# Patient Record
Sex: Male | Born: 1945
Health system: Southern US, Community
[De-identification: ages and names within clinical notes are randomized; demographics above are authoritative.]

## PROBLEM LIST (undated history)

## (undated) DIAGNOSIS — H3530 Unspecified macular degeneration: Secondary | ICD-10-CM

## (undated) DIAGNOSIS — H9190 Unspecified hearing loss, unspecified ear: Secondary | ICD-10-CM

## (undated) DIAGNOSIS — J449 Chronic obstructive pulmonary disease, unspecified: Secondary | ICD-10-CM

## (undated) DIAGNOSIS — H269 Unspecified cataract: Secondary | ICD-10-CM

## (undated) DIAGNOSIS — M069 Rheumatoid arthritis, unspecified: Secondary | ICD-10-CM

## (undated) HISTORY — PX: LUNG BIOPSY: SHX232

---

## 2006-10-14 ENCOUNTER — Ambulatory Visit: Payer: Self-pay | Admitting: Pulmonary Disease

## 2006-10-14 ENCOUNTER — Inpatient Hospital Stay (HOSPITAL_COMMUNITY): Admission: AD | Admit: 2006-10-14 | Discharge: 2006-10-27 | Payer: Self-pay | Admitting: Thoracic Surgery

## 2006-10-14 ENCOUNTER — Ambulatory Visit: Payer: Self-pay | Admitting: Thoracic Surgery

## 2006-10-15 ENCOUNTER — Encounter (INDEPENDENT_AMBULATORY_CARE_PROVIDER_SITE_OTHER): Payer: Self-pay | Admitting: Specialist

## 2006-10-17 ENCOUNTER — Ambulatory Visit: Payer: Self-pay | Admitting: Infectious Diseases

## 2006-10-19 ENCOUNTER — Ambulatory Visit: Payer: Self-pay | Admitting: Internal Medicine

## 2006-10-22 ENCOUNTER — Encounter (INDEPENDENT_AMBULATORY_CARE_PROVIDER_SITE_OTHER): Payer: Self-pay | Admitting: *Deleted

## 2006-11-19 ENCOUNTER — Encounter: Admission: RE | Admit: 2006-11-19 | Discharge: 2006-11-19 | Payer: Self-pay | Admitting: Thoracic Surgery

## 2006-11-19 ENCOUNTER — Ambulatory Visit: Payer: Self-pay | Admitting: Thoracic Surgery

## 2006-11-29 ENCOUNTER — Ambulatory Visit: Payer: Self-pay | Admitting: Thoracic Surgery

## 2008-06-11 IMAGING — CR DG CHEST 1V PORT
2 series · 2 of 2 positions shown · non-contrast
Comparison: 10/14/2006

CLINICAL DATA: Lung abscess. Right empyema.

PORTABLE CHEST - 1 VIEW

[AP (1 of 2)]
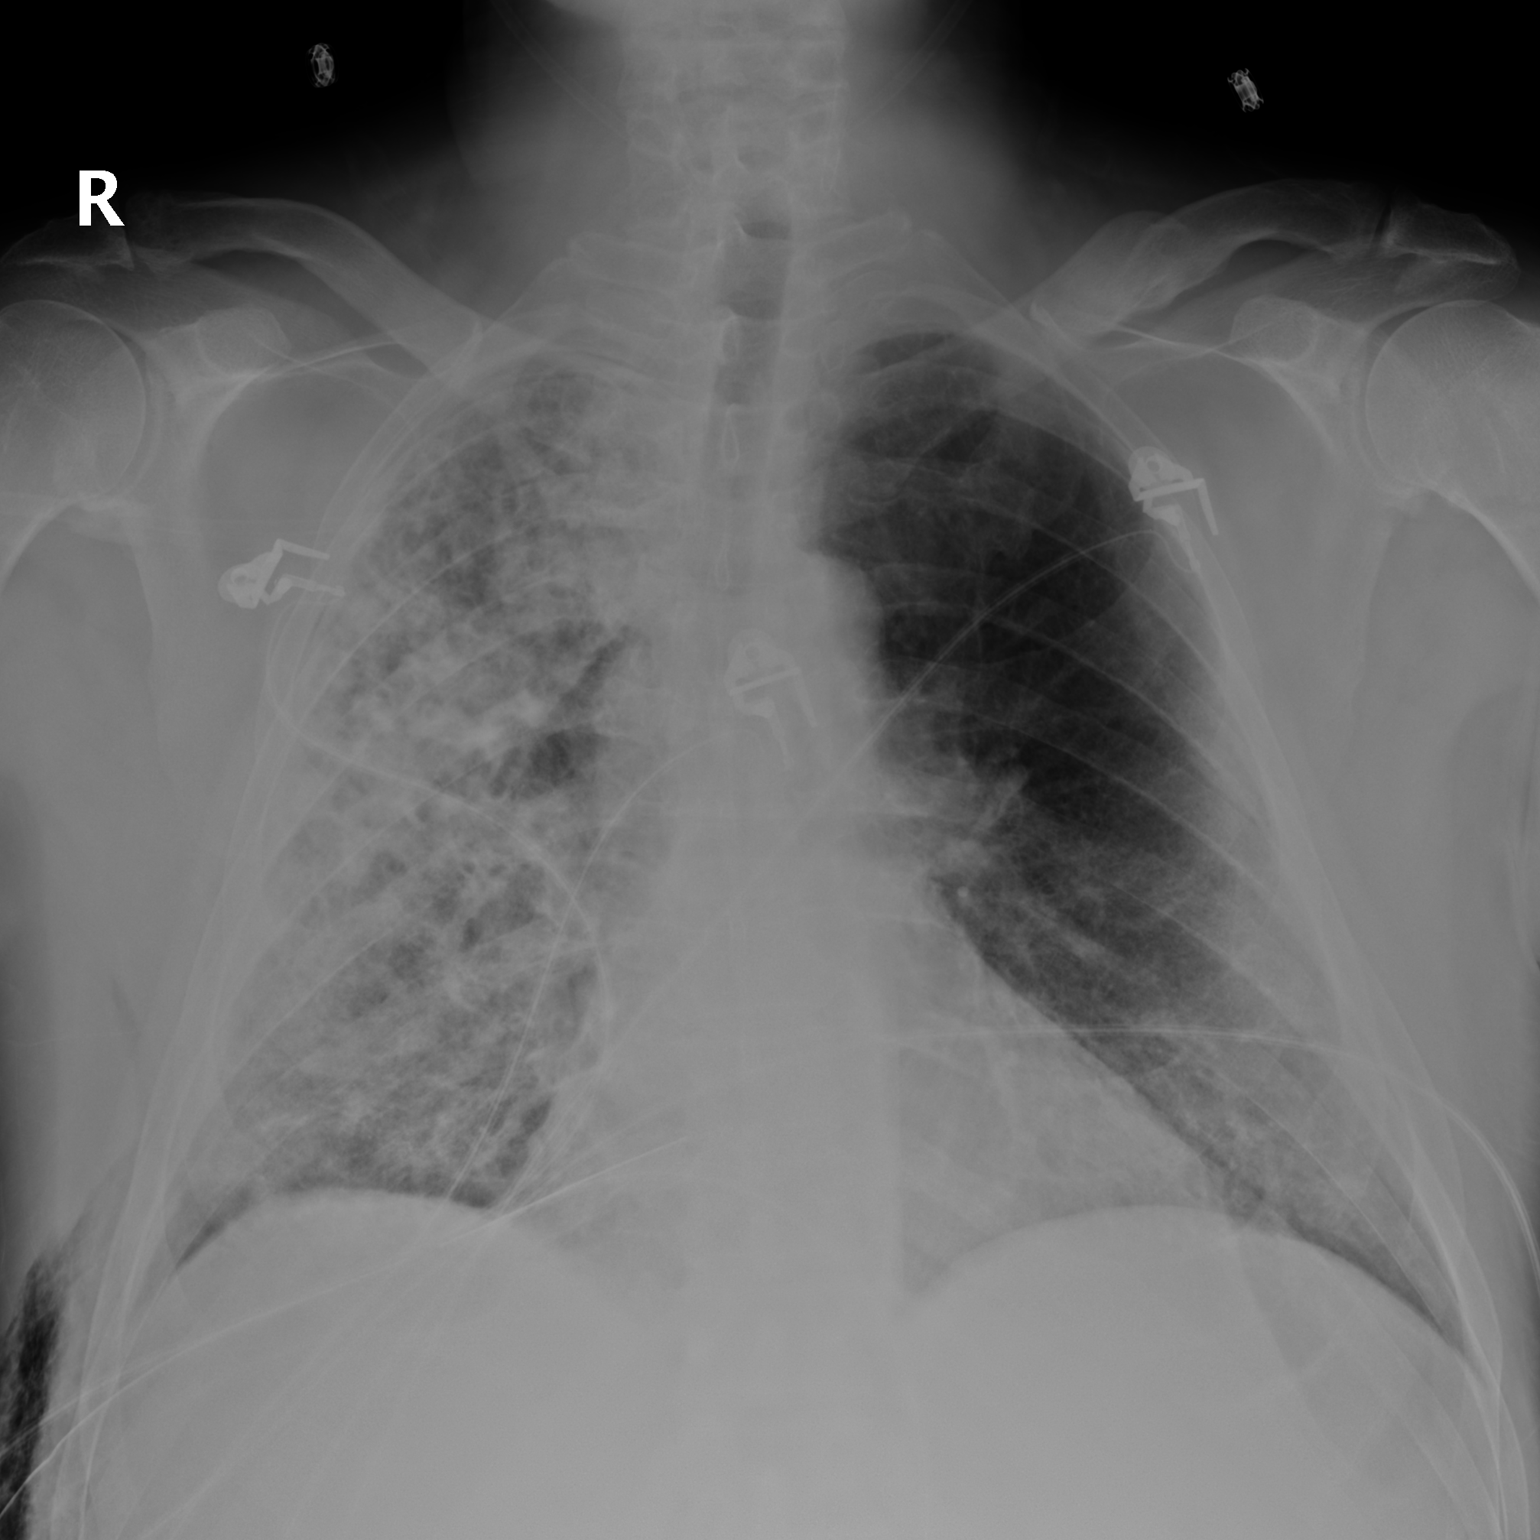

[AP (2 of 2)]
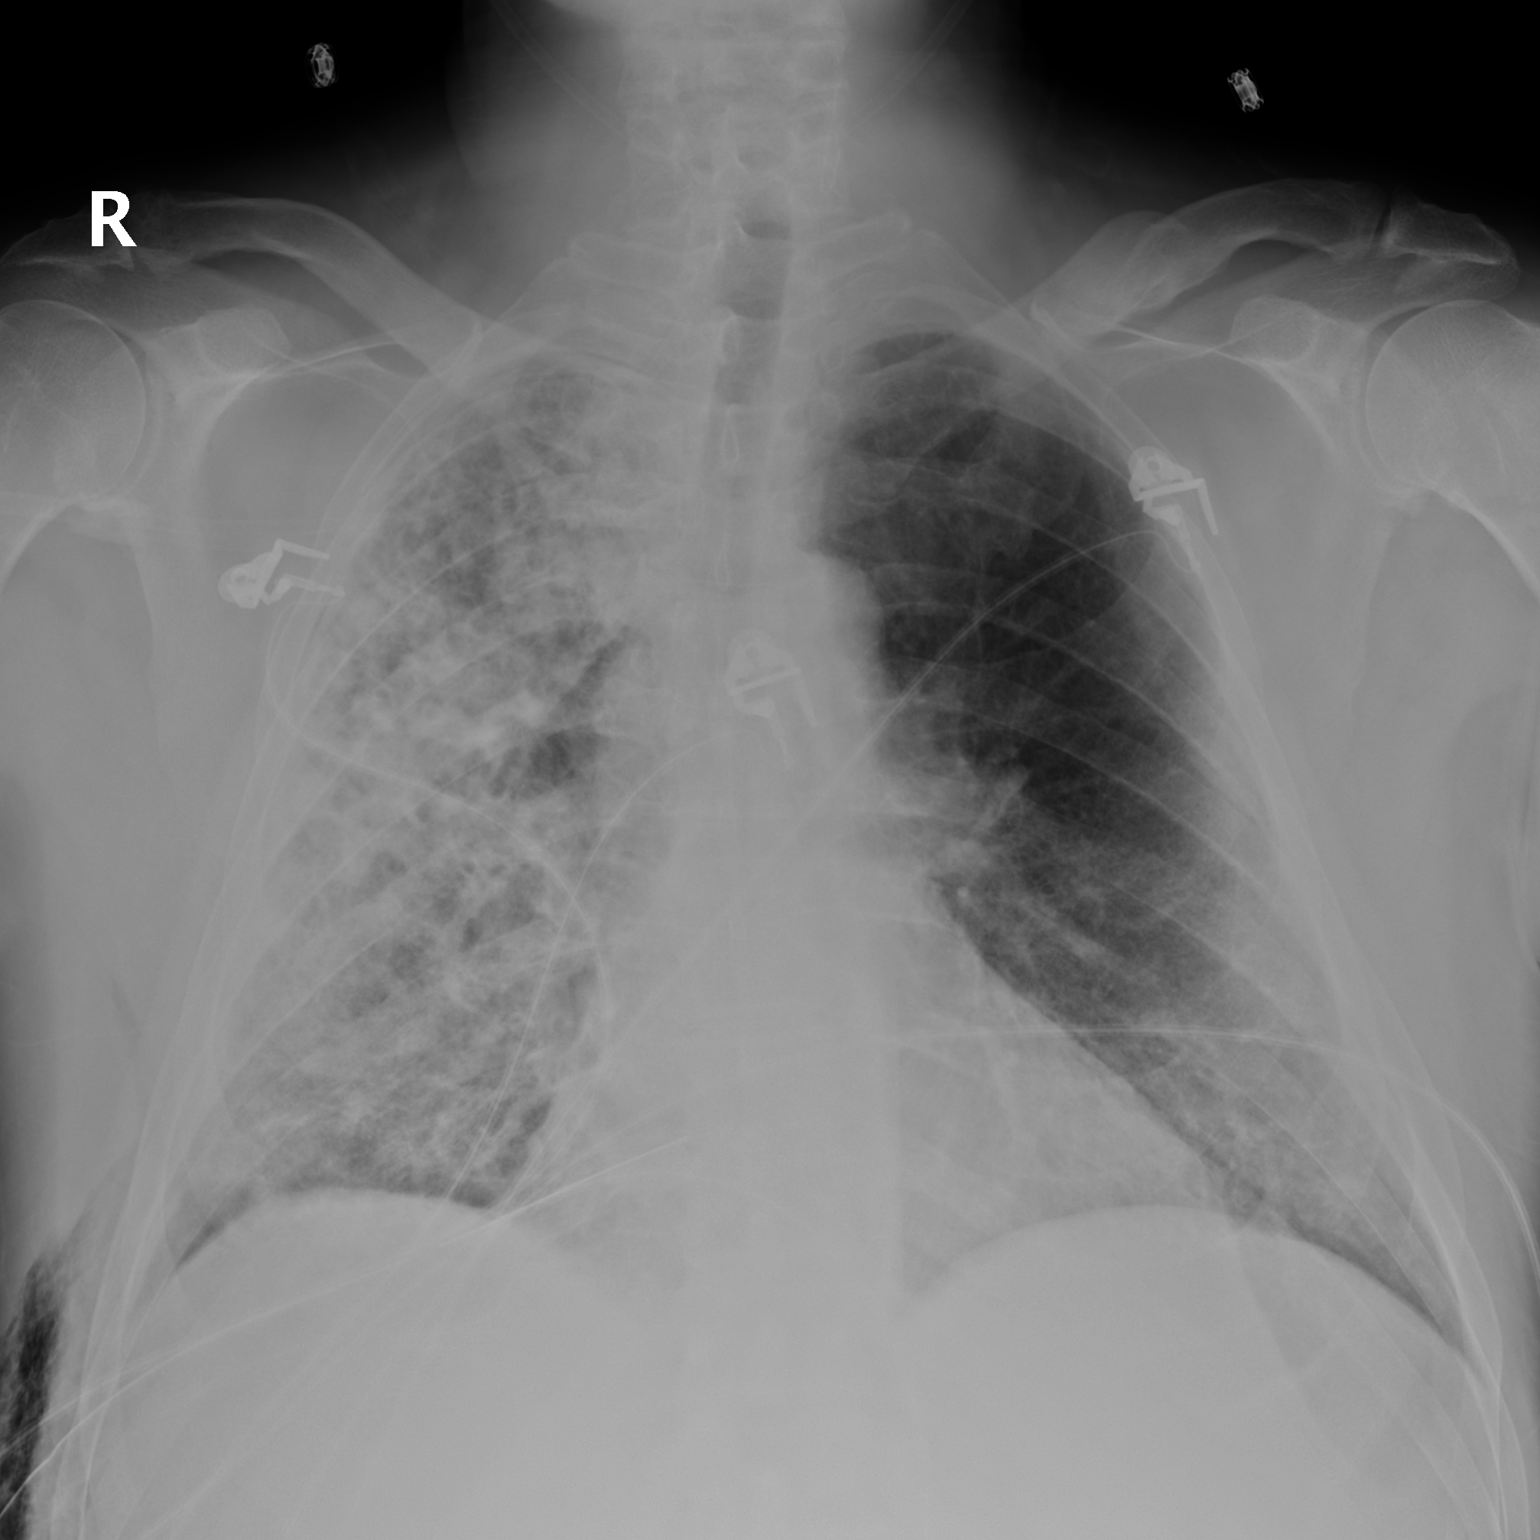

[2 of 2 positions shown; findings below may reference images not displayed]

FINDINGS: There is continued consolidation in the right upper lobe and likely
portions of the right lower lobe superimposed on emphysema, causing
heterogeneous density in the right hemithorax. Right basilar chest tube is in
place. There is interstitial prominence on the left side. No cardiomegaly.

Deformity from old left clavicular fracture noted. There is mild subcutaneous
emphysema on the right.  

IMPRESSION

1. Consolidation of much of the right upper lobe and probably portions the right
lower lobe, superimposed on emphysema, causing a heterogeneous consolidation.
2. Faint interstitial prominence on the left side, possibly chronic.

## 2008-06-13 IMAGING — CR DG CHEST 1V PORT
1 series · 1 of 1 positions shown · non-contrast
Comparison: Earlier exam today at [DATE] hours.

CLINICAL DATA: Pneumonia.  Right chest tube removal.  
 PORTABLE CHEST ? 1 VIEW ([DATE] HOURS):

[view not recorded]
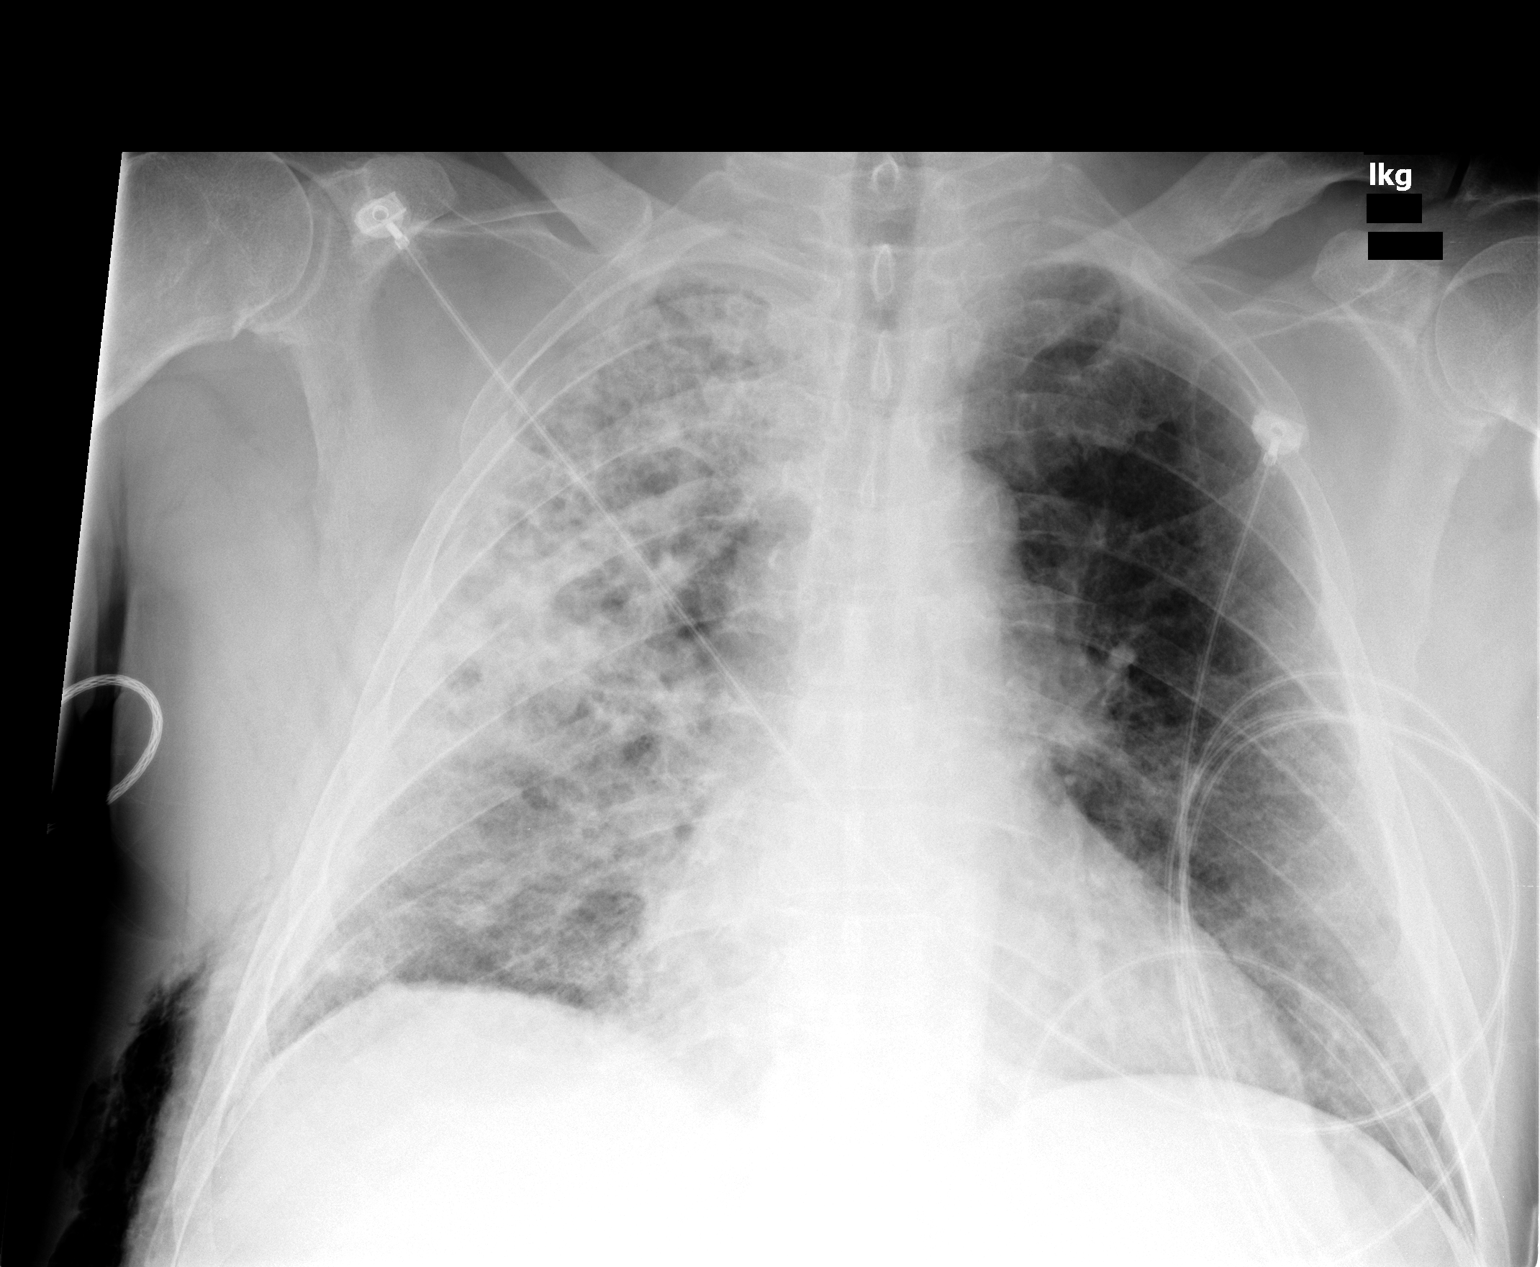

[1 of 1 positions shown; findings below may reference images not displayed]

FINDINGS: Extensive right lung infiltrate is again noted.  Right pleural chest tube has been removed.  No pneumothorax.  Right lower chest wall subcutaneous emphysema.
IMPRESSION: Specifically no right pneumothorax postremoval of chest tube.

## 2008-06-13 IMAGING — CR DG CHEST 1V PORT
1 series · 1 of 1 positions shown · non-contrast
Comparison: Yesterday?s exam.

CLINICAL DATA: Lung abscess.  Pneumonia.  
 PORTABLE CHEST - 1 VIEW ([DATE] HOURS):

[view not recorded]
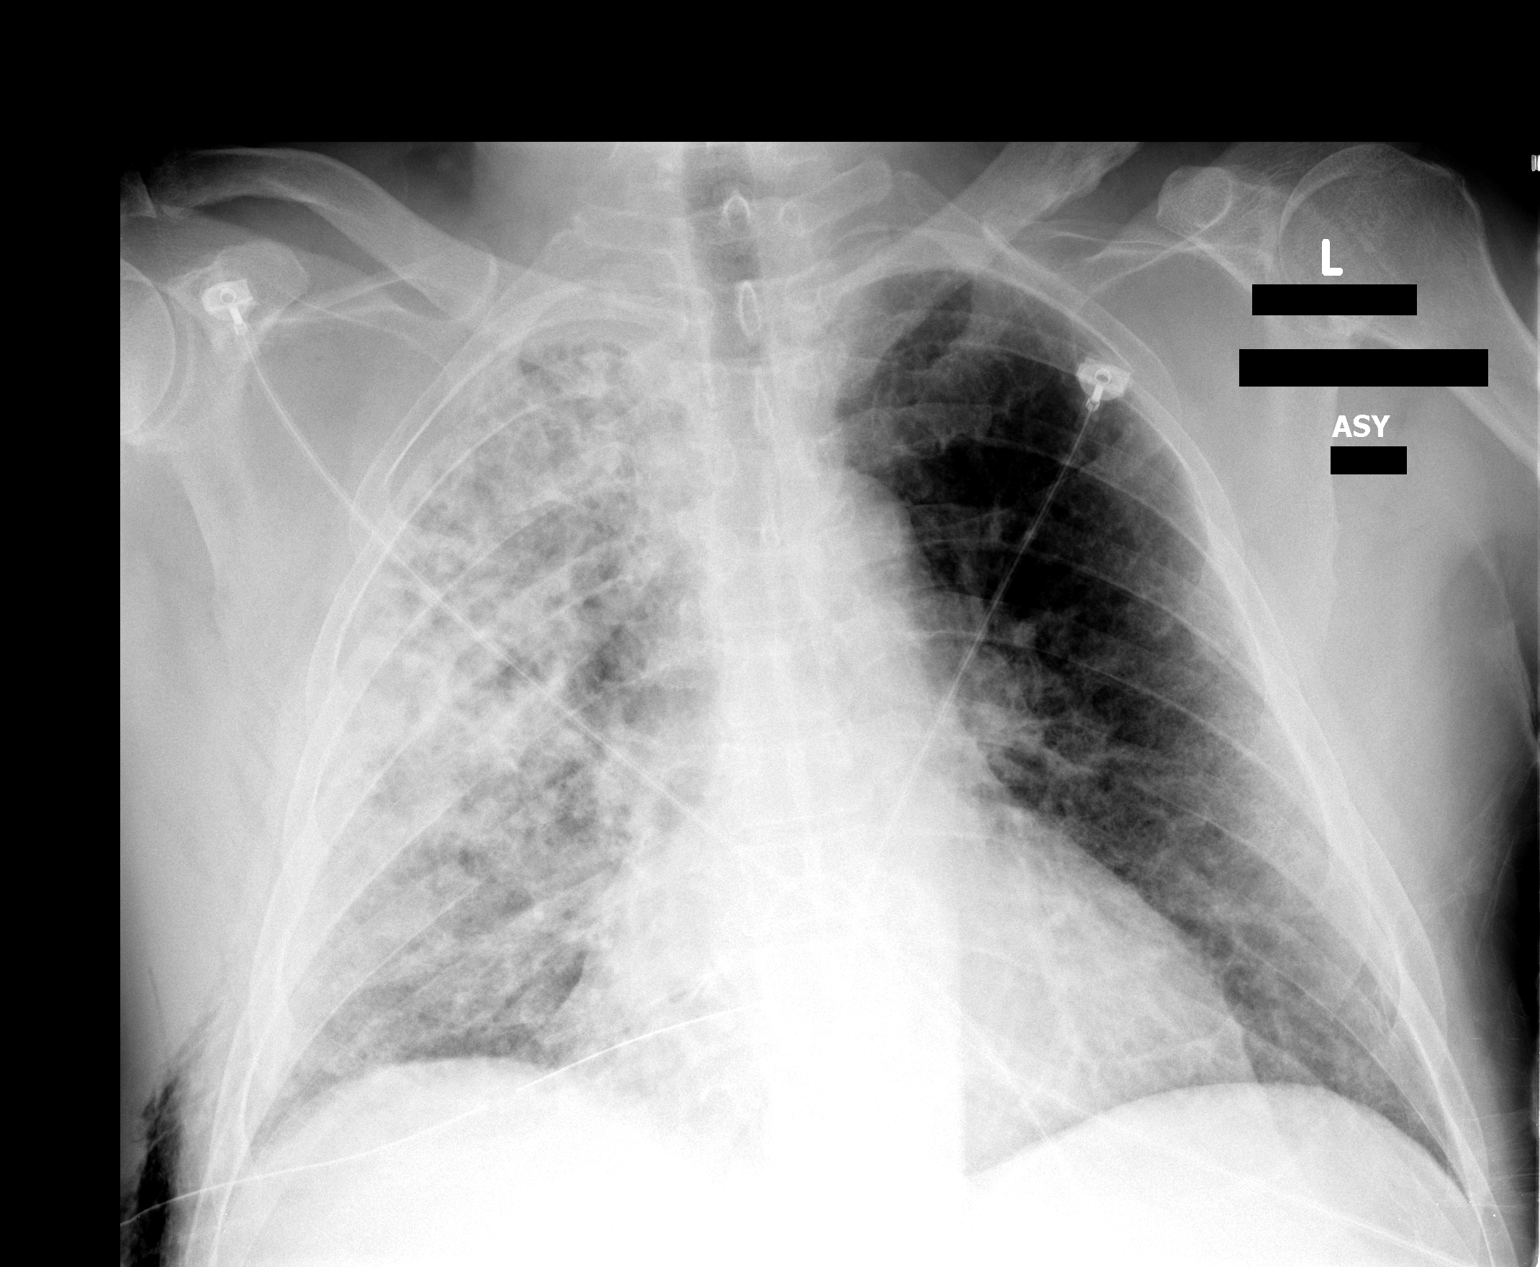

[1 of 1 positions shown; findings below may reference images not displayed]

FINDINGS: Diffuse right lung airspace disease is again noted and appears unchanged.  No pneumothorax.  Right pleural chest tube unchanged in position.  Mild right lower chest wall subcutaneous emphysema is again noted.
IMPRESSION: No significant change in diffuse right lung airspace infiltrate.

## 2010-07-23 ENCOUNTER — Encounter: Payer: Self-pay | Admitting: Thoracic Surgery

## 2010-11-17 NOTE — Discharge Summary (Signed)
David, [name]             ACCOUNT NO.:  [id number]   MEDICAL RECORD NO.:  [id number]          PATIENT TYPE:  INP   LOCATION:  2017                         FACILITY:  [facility]   PHYSICIAN:  Ines Bloomer, M.D. DATE OF BIRTH:  [birth year]   DATE OF ADMISSION:  10/14/2006  DATE OF DISCHARGE:  10/27/2006                               DISCHARGE SUMMARY   ADDENDUM:   NEW DISCHARGE DIAGNOSIS:  Acute renal insufficiency, resolved.   HISTORY OF PRESENT ILLNESS:  [name] was originally scheduled for  discharge home on 10/22/06, however, he developed acute renal  insufficiency with a peak creatinine of 2.71.  A nephrology consultation  was obtained and his discharge was delayed for further workup.  It was  felt that one contributing cause for his acute renal insufficiency could  be the IV vancomycin with which he was being treated for an empyema.  This was discontinued and he was treated with IV hydration and  conservative management.  His creatinine did immediately start to trend  downward and presently has stabilized at 2.4.  The renal service has  continued to follow him and feels that in light of his improving non  oliguric acute renal failure that he may be followed closely as an  outpatient and a full renal recovery is anticipated.  After his  vancomycin was discontinued, he was started on doxycycline and it is  felt he should continue this regimen at home for his empyema.  From a  pulmonary standpoint, he has remained stable.  His chest x-ray is  showing signs of improvement.  His cough has improved significantly.  He  continues to ambulate in the halls and tolerate a regular diet.  His  most recent labs show a hemoglobin of 9.4, hematocrit 28.2, white count  7.3, platelets 235.  Sodium 137, potassium 5.2, BUN 27, creatinine 2.4.  His wounds are all healing well.  It is felt that since he has remained  stable and his renal function is improving, he may be discharged home  on  10/27/06.   DISCHARGE MEDICATIONS:  1. Doxycycline 100 mg b.i.d.  2. Vicodin one to two q.4 hours p.r.n. for pain.   DISCHARGE INSTRUCTIONS:  His discharge instructions are unchanged from  the previously dictated discharge summary.  A follow-up appointment with  Dr. Edwyna Shell will be delayed for 2-3 weeks since his discharge was delayed  and he will have a chest x-ray from [facility] as well as a  CBC and BMP prior to this appointment.  He will follow-up with the renal  service as directed.  He will also follow-up with cancer doctor as  directed.  In the interim, he will call if he has problems or questions.  Coral Ceo, P.A.      Ines Bloomer, M.D.  Electronically Signed    GC/MEDQ  D:  10/27/2006  T:  10/27/2006  Job:  [id number]   cc:   Ihor Austin. Bufford Buttner, MD  Seward Speck Ninetta Lights, M.D.  Gailen Shelter, MD  Ines Bloomer, M.D.  [facility]  

## 2010-11-17 NOTE — Consult Note (Signed)
[name], [name]             ACCOUNT NO.:  [id number]   MEDICAL RECORD NO.:  [id number]          PATIENT TYPE:  INP   LOCATION:  2017                         FACILITY:  [facility]   PHYSICIAN:  Yetta Barre, M.D.  DATE OF BIRTH:  1945/10/13   DATE OF CONSULTATION:  10/22/2006  DATE OF DISCHARGE:                                 CONSULTATION   REASON FOR CONSULTATION:  Acute renal failure.   CONSULT REQUESTED BY:  Dr. Karle Plumber.   HISTORY OF PRESENTING ILLNESS:  [name] is a 65 year old white man  with a history of left lung cancer, status post chemo and radiotherapy,  lost to follow up, who presented on October 14, 2006 to [facility] from  [facility] for further management of right empyema and right  pneumothorax.  About 1 1/2 weeks prior to admission, the patient was  admitted to a hospital in [place] for fevers, chills, hemoptysis, and  pleuritic chest pain, and signed out AMA in order to be closer to family  here in [place].  He presented to the [facility] on family  insistence for the same.  He was found to have the above.  Chest tube  was placed and then the patient was transferred here to [facility].  The patient was started empirically on vancomycin on October 14, 2006.  His creatinine went from 0.86 on October 14, 2006, which was  normal, to 2.67 today after which vancomycin was held on October 20, 2006.   ALLERGIES:  NO KNOWN DRUG ALLERGIES.   PAST MEDICAL HISTORY:  Left lung cancer.  Diagnosed 4 years ago in  [place].  Type uncertain.  Status post chemotherapy times 2 cycles, and  status post radiotherapy for 2 years.  Last treatment about 2 to 3  months ago.  Lost to follow up thereafter.   PAST SURGICAL HISTORY:  No past surgical history.   CURRENT MEDICATIONS IN THE HOSPITAL:  Vancomycin IV, which was held on  October 20, 2006.  Albuterol, Atrovent, Protonix, Vicodin, Tussionex  p.r.n., and morphine sulfate IV p.r.n.  On  admission, the patient was on  Levaquin and Zosyn.   SOCIAL HISTORY:  The patient lives in [place] alone.  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  He has 4 children who are healthy.  The patient smoked around 2 to 3  packs of cigarettes a day for about 25 years.  He quit 4 months ago.  No  history of alcohol or drug abuse.   FAMILY HISTORY:  The patient's mother is alive.  She has multiple  medical problems.  Father died in his 51s from a brain tumor.  The  patient has one brother with renal carcinoma, status post nephrectomy  one month ago.   REVIEW OF SYSTEMS:  The patient has a history of fevers, chills, sweats,  fatigue, shortness of breath, pleuritic chest pain, and hemoptysis on  admission.  Now currently, his chest pain has almost resolved with  slight shortness of breath still remaining.  No history  of frequency,  urgency, dysuria, hematuria, nocturia.  No history of nausea, vomiting,  diarrhea, or bright red blood per rectum.   PHYSICAL EXAMINATION:  VITAL SIGNS:  Temperature 97.9, blood pressure  140/92, pulse 103, respirations 18, O2 sats 95% on 2 liters.  GENERAL APPEARANCE:  The patient did not appear in acute respiratory  distress.  EYES:  Pupils equal, round, and reactive to light.  Extraocular  movements intact.  ENT:  Oropharynx is clear.  No erythema or exudate.  Dentition is poor.  NECK:  Supple.  No JVD.  HEART:  Tachycardic.  Regular rhythm.  No murmurs, rubs, or gallops.  LUNGS:  Air entry decreased on right side with occasional wheezes.  Basilar rales on left side.  ABDOMEN:  Soft, nondistended, nontender.  Bowel sounds present.  EXTREMITIES:  No edema, cyanosis, or clubbing.  Good peripheral pulses.  NEUROLOGICALLY:  Alert and oriented times 4.  Cranial nerves II-XII  intact.  Strength 5/5 bilaterally equal.  Reflexes 2+ bilaterally equal.  Grossly nonfocal.  No asterixis.   LABORATORY DATA:  Sodium 139,  potassium 4.3, chloride 107, Bicarb 27,  BUN 16, creatinine 2.67 which is increased from 2.28, increased from  1.91, increased from 0.87 on October 16, 2006.  Glucose of 125, calcium  7.8.  Hemoglobin 10.7, hematocrit 31.2, white cell count 12.7, platelets  of 299,000.  Chest x-ray on October 21, 2006 showing right  hydropneumothorax and necrotic right upper lobe pneumonia questionable,  bronchopleural fistula.   SURGICAL PATHOLOGICAL FINDINGS:  On October 15, 2006 showing benign  bronchial mucosa with scattered inflammatory cells.  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  Pathology on October 15, 2006 showing adenocarcinoma.  Sputum  culture on October 15, 2006, times two, positive for MRSA sensitive to  vancomycin, Bactrim, and tetracycline.   ASSESSMENT/PLAN:  1. Non-oliguric acute renal failure.  Differential diagnosis is broad.      Namely:      a.     Pre-renal with history of MRSA positive pneumonia.  This       could possibly be secondary to sepsis.  However, the patient's       blood pressure is stable and not decreased.  This may also       represent volume depletion who is status post radiotherapy and       chemotherapy for lung cancer.  To rule out the above, we will       check a urine sodium to creatinine ratio, and also check       orthostatics.  Actually during dictation, the patient was not       found to be orthostatic, hence pre-renal causes unlikely.      b.     Post renal causes.  ? Metastasis/carcinoma blocking urinary       flow.  However, the patient has had no difficulty urinating all       this time.  Will check a renal ultrasound to look for the same,       for hydronephrosis, and for a better look at renal architecture.      c.     Renal.  This could be ATN from sepsis/hypotension from MRSA       pneumonia.  No history of contrasted studies recently.  The CT of       the chest that was done on admission was without contrast.  Plan      is to check blood cultures times 2, and check  urine for  cast,       muddy brown cast, or other source of casts.  This could also be       AIN, namely drug (vancomycin) versus urinary tract infection.       However, the patient does not have symptoms versus infiltrated       (carcinoma).  Plan is to check urine for WBCs, eosinophils, RBC       for cast.  To hold vancomycin for now.  Please see attached graph       to this dictation.  Trend the correlation of vancomycin to       creatinine.  Possible change of antibiotics to doxicycline IV       instead of vancomycin.  I would refrain from starting Bactrim as       well, as this is a sulfa containing drug, and could potentially be       harmful as well.  Other renal causes could be vasculitis as       evidence by pathology report of lung tissue.  Can there be       involvement of kidneys as well ? Doubt with rapid rise of       creatinine.  However, will check CANCA, P-ANCA, ESR, ANA,       compliment C3 and 4 levels.  We will follow renal function daily       and await results of the above test.  2. Right adenocarcinoma of the lung.  Most likely represents a      recurrence.  The patient has already been seen by Dr. Arbutus Ped here      in the hospital.  Records from [facility] are currently unavailable.      The patient apparently has not called Dr. Carmon Ginsberg in [place], hence      further management will be deferred to oncologist.  3. Right empyema (MRSA) status post chest tube placement.  As stated      above, to hold vancomycin and to consider doxicycline IV since      culture sensitivity reports MRSA is sensitive to doxi.  I would      refrain from starting Bactrim since it contains sulfa medication,      and this could potentially be harmful to this patient's      kidneys.  Management per Dr. Edwyna Shell.  4. Normocytic anemia, most likely secondary to problem #2.  Will check      iron panel however.   Thank you for the consult.  We will follow with you.      Yetta Barre, M.D.   Electronically Signed     SS/MEDQ  D:  10/22/2006  T:  10/22/2006  Job:  [id number]

## 2010-11-17 NOTE — H&P (Signed)
[name], [name]             ACCOUNT NO.:  [id number]   MEDICAL RECORD NO.:  [id number]          PATIENT TYPE:  INP   LOCATION:  2101                         FACILITY:  [facility]   PHYSICIAN:  Ines Bloomer, M.D. DATE OF BIRTH:  [birth year]   DATE OF ADMISSION:  10/14/2006  DATE OF DISCHARGE:                              HISTORY & PHYSICAL   REASON FOR ADMISSION:  Right empyema.   HISTORY OF PRESENT ILLNESS:  The patient is a 65 year old white male who  was transferred today from [facility] for evaluation of right  empyema right pneumothorax.  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  He was seen in the emergency department in [place], [place], and was started on antibiotics for a probable pneumonia.  The details of this admission were unclear, however, the patient did  admit to leaving against medical advice and returning back to [place].  He continued on p.o. antibiotics and steroids but his  symptoms continued to worsen.  Once he returned to [facility], he  reports continued fevers, chills and worsening cough with purulent  sputum production, some hematemesis, and increasing shortness of breath.  At the requested of his family members, he presented to the emergency  department at [facility] on October 12, 2006, and was found to have  evidence of a right pneumonia on chest x-ray.  He was admitted under the  service of Dr. Selinda Flavin and was started on antibiotic therapy.  He  continued to have worsening shortness of breath and a chest x-ray  performed on October 13, 2006, revealed a large right pneumothorax.  Because of this finding, a chest tube was placed and subsequently the  patient was transferred to [facility] under the care of Dr. Dewayne Shorter for continued management of the pneumothorax and treatment of his  right pneumonia/empyema.  Of note,  the patient does report a history of  lung cancer which was diagnosed approximately 3 to 4 years ago.  He  apparently has been under the care of Dr. Bufford Buttner in [place], [place],  but again, the details of this treatment and history are unclear.  The  patient is a somewhat poor historian.  He does report having had  radiation therapy up until approximately a month ago and apparently  started chemotherapy in the past but could not tolerate it and this was  discontinued.   PAST MEDICAL HISTORY:  Lung cancer as described in details above.   PAST SURGICAL HISTORY:  None.   CURRENT MEDICATIONS ON ADMISSION:  1. Levaquin 500 mg IV daily.  2. Zosyn 3.375 g every 6 hours IV.  3. Morphine 4 mg IV every 3 to 4 hours p.r.n. pain.  4. Vicodin 1 to 2 every 4 hours p.r.n. pain.  5. Zofran 4 mg IV every 6 hours p.r.n. pain.  He takes no medications      on a regular basis at home.   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  He previously smoked 3 to 4 packs of cigarettes per day and has  smoked for 50+ years.  He reports quitting 3-4 months ago due to  increasing shortness of breath.  He also previously consumed alcohol  socially, although he stopped this many years ago.   FAMILY HISTORY:  Per the patient's report, family history is  noncontributory.   REVIEW OF SYSTEMS:  See history of present illness for pertinent  positives and negatives.  He specifically denies any visual changes,  neurologic symptoms, TIA symptoms, amaurosis fugax, syncope, weight  loss, chest pain, heart palpitations, abdominal pain, nausea, vomiting,  diarrhea, constipation, reflux symptoms, hematochezia, melena, dysuria,  hematuria, nocturia, lower extremity edema, claudication symptoms, rest  pain, nonhealing lower extremity ulcers,  anxiety, depression,  intolerance to heat or cold.   PHYSICAL EXAM:  VITAL SIGNS:  Blood pressure is 147/97, heart rate 89  and regular,  respirations 17 and unlabored.  O2 sat 94% on 2 L.  GENERAL:  This is a well-developed, well-nourished white male who  appears to be breathing comfortably at this time on 2 L of supplemental  oxygen.  HEENT:  Normocephalic, atraumatic.  Pupils equal, round and react to  light and accommodation.  Extraocular movements intact.  Ears and nose  externally appear within normal limits.  Oropharynx is clear and he is  edentulous.  NECK:  Supple without lymphadenopathy, thyromegaly, or carotid bruit.  LUNGS:  Coarse rhonchi on the right with decreased breath sounds in the  bases.  There is a right chest tube in place which is connected to 20 cm  wall suction and secured at the chest tube site.  There is  serosanguineous drainage in the Pleur-evac and no obvious air leak.  ABDOMEN:  Soft, nontender, nondistended with active bowel sounds in all  quadrants.  No masses or hepatosplenomegaly.  HEART:  Regular rate and rhythm without murmurs, rubs or gallops.  EXTREMITIES:  He has trace lower extremity edema.  Feet are warm and  well perfused.  He has 2+ femoral and dorsalis pedis pulses and 1+  posterior tibial pulses.  NEURO:  Cranial nerves, II through XII, grossly intact.  He is alert and  oriented x3.  He is a somewhat poor historian, however.  Muscle strength  is symmetrical bilaterally.   ASSESSMENT/PLAN:  This is a 65 year old male with a history of lung  cancer and a new right empyema/pneumonia with pneumothorax.  He will be  admitted to Dr. Scheryl Darter service and a pulmonary critical care consult  will be obtained.  We will continue his current antibiotic treatments  and obtain further labs as well as a sputum culture, a culture of the  drainage in his Pleur-evac.  He will also have a chest CT this evening  to further evaluate his right upper lobe area.  A decision will be made  regarding further treatment and possibly even a bronchoscopy once the evaluation is completed.      Coral Ceo, P.A.      Ines Bloomer, M.D.  Electronically Signed    GC/MEDQ  D:  10/14/2006  T:  10/15/2006  Job:  [id number]   cc:   Ihor Austin. Bufford Buttner, MD  Selinda Flavin

## 2010-11-17 NOTE — Op Note (Signed)
[name], [name]             ACCOUNT NO.:  [id number]   MEDICAL RECORD NO.:  [id number]          PATIENT TYPE:  INP   LOCATION:  2101                         FACILITY:  [facility]   PHYSICIAN:  Gailen Shelter, MD  DATE OF BIRTH:  09/01/1945   DATE OF PROCEDURE:  10/15/2006  DATE OF DISCHARGE:                               OPERATIVE REPORT   PROCEDURE:  Bronchoscopy.   INDICATIONS OF PROCEDURE:  Necrotizing right upper lobe pneumonia in a  patient with a history of prior carcinoma of the lung.   This is a 65 year old gentleman from [place] who presented on  April 14 as a transfer from [facility] for evaluation of right  lung empyema and necrotizing pneumonia.  The patient has a history of  non-small cell carcinoma in the past treated with what appears to be  radiation.  We are asked to bronchoscope the patient to exclude  recurrent carcinoma.  After discussing the case with a Karle Plumber I  agreed that this was of reasonable step in diagnosing this patient's  process.  The patient had the benefits, limitations and complications of  procedure explained to him and he agreed to proceed.  For the details of  the consultation note please refer to the note done on April 14 and  recorded on the patient's chart.   DESCRIPTION OF PROCEDURE:  The patient was taken to the endoscopy suite  where IV access was obtained.  The patient had blood pressure, heart  rate, respiratory rate and oxygen saturations monitored throughout.  The  patient was on 2 liters nasal cannula, placed on 4 liters nasal cannula  for the procedure with no desaturations noted.  The patient has an ASA  III for the purpose of the procedure with an airway class II.   The patient received a total of 75 mcg of fentanyl IV and Versed 5 mg IV  for conscious sedation.  Lidocaine 2% was instilled via bronchial lavage  and a total of 25 mL was used.  The patient also had Cetacaine to the  posterior pharynx  for anesthesia.   After the patient had proper conscious sedation and topical anesthesia  the patient had the Pentax video bronchoscope advanced via the oral  route.  The vocal cords were noted to be normal.  Trachea was normal.  Carina was sharp.  However, purulent secretions could be seen pooled  about the carina.  These were suctioned.  The left mainstem bronchus was  then entered and the right upper lobe lingula subsegments and lower lobe  subsegments were noted to be free of masses.  The patient had some  purulent secretions noted throughout with no particular orifice being  predominant in these.  He has chronic bronchitic changes but again no  masses noted.  At this point the bronchoscope was brought to the carina  and the right mainstem bronchus was entered.  The right upper lobe  apical subsegment could be noted to collapse with cough.  The patient  had dynamic airway collapse.  The patient also had excessive mucus  purulent secretions noted from the posterior  right upper lobe  subsegments.  At this point the bronchoscope was brought to the right  middle lobe and right lower lobe subsegments and again no endobronchial  lesions were noted.  The patient did have chronic inflammatory changes.  At this point the bronchoscope was brought back to the attention of the  right upper lobe and with the guidance of fluoroscopy biopsies were done  times four of the right upper lobe posterior and apical subsegments and  then the area was lavage.  At this point we also did brushings of the  apical subsegment of the right upper lobe.  The procedure was terminated  due to the patient's excessive cough.  No overt complications were  noted.  No desaturations were noted during the procedure.  BAL was  performed on the right upper lobe to complete the procedure.  The  bronchoscope was then withdrawn, noted that there was appropriate  hemostasis.  Estimated blood loss was less than 5 mL.   The  patient was taken to the recovery area in satisfactory condition.  He will be monitored then transferred back to his ward.  Chest x-ray to  exclude pneumothorax is pending.  Plan is therefore to await the report  of cytology and culture results of the samples obtained during the  bronchoscopy.  Further plans for the empyema will be per Dr. Edwyna Shell.   IMPRESSION:  Necrotizing pneumonia in the right upper lobe with no  endobronchial lesions noted.  Status post bronchial brushings and  bronchoalveolar lavage, all sent for cytology and cultures.   PLAN:  The plan will be to await cytology and cultures.  Plan for the  empyema per Dr. Edwyna Shell.      Gailen Shelter, MD  Electronically Signed     CLG/MEDQ  D:  10/15/2006  T:  10/15/2006  Job:  [id number]

## 2010-11-17 NOTE — Discharge Summary (Signed)
NAMEVELDON, WAGER             ACCOUNT NO.:  [id number]   MEDICAL RECORD NO.:  [id number]          PATIENT TYPE:  INP   LOCATION:  2017                         FACILITY:  [facility]   PHYSICIAN:  Ines Bloomer, M.D. DATE OF BIRTH:  10-25-1945   DATE OF ADMISSION:  10/14/2006  DATE OF DISCHARGE:  10/22/2006                               DISCHARGE SUMMARY   PRIMARY ADMITTING DIAGNOSIS:  Right empyema.   ADDITIONAL AND DISCHARGE DIAGNOSES:  1. Right to methicillin-resistant Staphylococcus aureus empyema.  2. Adenocarcinoma of the right lung.  3. Recent right pneumothorax.   PROCEDURES PERFORMED:  Bronchoscopy.   HISTORY:  The patient is a 65 year old white male who was transferred on  the date of this admission from [facility] for evaluation of a  right empyema and a right pneumothorax.  The patient developed flu-like  symptoms a week or two prior to admission and was seen in a hospital in  [place], [place], at which time he was started on antibiotics for  probable pneumonia.  The patient reports leaving the hospital against  medical advice and returning to [facility].  He continued on p.o.  antibiotics and steroids.  However, his symptoms continued to worsen.  He presented to the emergency department at [facility] on April 12 and was  found to have evidence of right pneumonia on chest x-ray.  He is  admitted under the service of Dr. Selinda Flavin and was started on  antibiotics.  His shortness of breath continued to worsen, and a chest x-  ray on April 13 revealed a large right pneumothorax.  Because of this  finding, a chest tube was placed, and the patient was transferred to  [facility] under the care of Dr. Dewayne Shorter for continued  management.   HOSPITAL COURSE:  Mr. Bellin was admitted to on October 14, 2006.  Upon  further questioning, the patient reported a history of lung cancer which  was diagnosed approximately 3-4 years ago and had been under the  care of  Dr. Carmon Ginsberg in [place], [place].  He reports having had radiation  therapy in the past but could not tolerate chemotherapy.  Because of his  presentation, his past medical history, and his physical exam findings,  a pulmonary critical care medicine consult was obtained for assistance  in management.  The patient was seen by Dr. Danice Goltz, and she  recommended proceeding with a bronchoscopy.  This was performed on October 15, 2006.  Multiple biopsies were performed at that time of the upper  lobe,  posterior and apical segments, as well as bronchial brushings and  washings.  No endobronchial lesions were noted.  The patient tolerated  the procedure well and was returned to the floor in stable condition.  His pneumothorax resolved on chest x-ray, and his chest tube was  subsequently discontinued.  Cultures from bronchoscopy were positive for  MRSA.  He had been previously started on vancomycin and Zosyn, and at  this point the Zosyn was discontinued, and he was continued on  vancomycin alone.  Final pathology from biopsy taken during the time  of  bronchoscopy was positive for adenocarcinoma.  An infectious disease  consult was obtained, and it was felt that he should continue least 3  weeks of antibiotic therapy, and the duration of therapy ultimately  would be determined by followup chest x-rays which could be performed as  an outpatient depending on his progress.   He has been seen by Dr. Arbutus Ped in consultation for oncology.  He  recommended close outpatient followup for his adenocarcinoma and felt  that he might require further chemotherapy after resolution of his  empyema.  The patient requested that he be allowed to return to Dr.  Carmon Ginsberg, his medical oncologist in [place], which is closer to his home  in [place], and Dr. Arbutus Ped agreed to arrange outpatient followup with  Dr. Carmon Ginsberg.   Overall, he has continued to progress.  His fevers have resolved.  White  count  is trending downward, and his most recent labs on October 21, 2006,  show a hemoglobin of 10.7, hematocrit 31.2, white count 12.7, platelets  299. Sodium 139, potassium 4.4, BUN 17, creatinine 2.2.  His creatinine  has started to trend over the past several days, and he will be seen by  nephrology if this does not resolve.  His chest x-ray continues to show  small right hydropneumothorax with an air-fluid level.  It is  anticipated that, if he continues to progress, his labs remain stable,  and no other acute changes occur, he will hopefully be ready for  discharge home in the next 24-48 hours.   DISCHARGE MEDICATIONS:  1. Vicodin 5/325 one to two q.4 h p.r.n. for pain.  2. Vancomycin per pharmacy, to be continued for 2 weeks IV via PICC      line if followed by home health.   DISCHARGE INSTRUCTIONS:  He is asked to refrain from driving, heavy  lifting or strenuous activity.  He may continue ambulating daily and  incentive spirometry.  He may shower daily and clean his incisions with  soap and water.   DISCHARGE FOLLOWUP:  He will see Dr. Edwyna Shell back in the office on April  29 with a chest x-ray from [facility].  He will need to  make an appointment to see Dr. Carmon Ginsberg in the next 1-2 weeks for followup  of his lung cancer.  In the interim if he experiences any problems or  has questions, he is asked to contact our office.      Coral Ceo, P.A.      Ines Bloomer, M.D.  Electronically Signed    GC/MEDQ  D:  10/21/2006  T:  10/21/2006  Job:  [id number]   cc:   Malachi Pro, MD, [place], [place]  Edwin Cap C. Ninetta Lights, M.D.  Gailen Shelter, MD

## 2012-03-05 DIAGNOSIS — R079 Chest pain, unspecified: Secondary | ICD-10-CM

## 2016-07-04 DIAGNOSIS — G43019 Migraine without aura, intractable, without status migrainosus: Secondary | ICD-10-CM | POA: Diagnosis not present

## 2016-07-04 DIAGNOSIS — I679 Cerebrovascular disease, unspecified: Secondary | ICD-10-CM | POA: Diagnosis not present

## 2016-07-04 DIAGNOSIS — F5104 Psychophysiologic insomnia: Secondary | ICD-10-CM | POA: Diagnosis not present

## 2016-07-04 DIAGNOSIS — G44201 Tension-type headache, unspecified, intractable: Secondary | ICD-10-CM | POA: Diagnosis not present

## 2016-07-17 DIAGNOSIS — J069 Acute upper respiratory infection, unspecified: Secondary | ICD-10-CM | POA: Diagnosis not present

## 2016-07-17 DIAGNOSIS — R0600 Dyspnea, unspecified: Secondary | ICD-10-CM | POA: Diagnosis not present

## 2016-07-17 DIAGNOSIS — R05 Cough: Secondary | ICD-10-CM | POA: Diagnosis not present

## 2016-07-17 DIAGNOSIS — J449 Chronic obstructive pulmonary disease, unspecified: Secondary | ICD-10-CM | POA: Diagnosis not present

## 2016-08-31 DIAGNOSIS — M25512 Pain in left shoulder: Secondary | ICD-10-CM | POA: Diagnosis not present

## 2016-08-31 DIAGNOSIS — M25511 Pain in right shoulder: Secondary | ICD-10-CM | POA: Diagnosis not present

## 2016-08-31 DIAGNOSIS — J449 Chronic obstructive pulmonary disease, unspecified: Secondary | ICD-10-CM | POA: Diagnosis not present

## 2016-08-31 DIAGNOSIS — Z719 Counseling, unspecified: Secondary | ICD-10-CM | POA: Diagnosis not present

## 2016-08-31 DIAGNOSIS — R05 Cough: Secondary | ICD-10-CM | POA: Diagnosis not present

## 2016-09-19 DIAGNOSIS — J40 Bronchitis, not specified as acute or chronic: Secondary | ICD-10-CM | POA: Diagnosis not present

## 2016-09-19 DIAGNOSIS — J449 Chronic obstructive pulmonary disease, unspecified: Secondary | ICD-10-CM | POA: Diagnosis not present

## 2016-10-19 DIAGNOSIS — M7501 Adhesive capsulitis of right shoulder: Secondary | ICD-10-CM | POA: Diagnosis not present

## 2016-10-29 DIAGNOSIS — M25531 Pain in right wrist: Secondary | ICD-10-CM | POA: Diagnosis not present

## 2016-11-12 DIAGNOSIS — S63501A Unspecified sprain of right wrist, initial encounter: Secondary | ICD-10-CM | POA: Diagnosis not present

## 2016-11-12 DIAGNOSIS — M25531 Pain in right wrist: Secondary | ICD-10-CM | POA: Diagnosis not present

## 2016-11-12 DIAGNOSIS — M79641 Pain in right hand: Secondary | ICD-10-CM | POA: Diagnosis not present

## 2016-11-15 DIAGNOSIS — R0600 Dyspnea, unspecified: Secondary | ICD-10-CM | POA: Diagnosis not present

## 2016-11-15 DIAGNOSIS — R0602 Shortness of breath: Secondary | ICD-10-CM | POA: Diagnosis not present

## 2016-11-15 DIAGNOSIS — Z87891 Personal history of nicotine dependence: Secondary | ICD-10-CM | POA: Diagnosis not present

## 2016-11-15 DIAGNOSIS — R600 Localized edema: Secondary | ICD-10-CM | POA: Diagnosis not present

## 2016-11-15 DIAGNOSIS — J449 Chronic obstructive pulmonary disease, unspecified: Secondary | ICD-10-CM | POA: Diagnosis not present

## 2016-11-15 DIAGNOSIS — M19042 Primary osteoarthritis, left hand: Secondary | ICD-10-CM | POA: Diagnosis not present

## 2016-11-15 DIAGNOSIS — M7989 Other specified soft tissue disorders: Secondary | ICD-10-CM | POA: Diagnosis not present

## 2016-11-15 DIAGNOSIS — M19041 Primary osteoarthritis, right hand: Secondary | ICD-10-CM | POA: Diagnosis not present

## 2016-11-15 DIAGNOSIS — M1990 Unspecified osteoarthritis, unspecified site: Secondary | ICD-10-CM | POA: Diagnosis not present

## 2016-11-15 DIAGNOSIS — R918 Other nonspecific abnormal finding of lung field: Secondary | ICD-10-CM | POA: Diagnosis not present

## 2016-11-15 DIAGNOSIS — J8410 Pulmonary fibrosis, unspecified: Secondary | ICD-10-CM | POA: Diagnosis not present

## 2016-11-21 DIAGNOSIS — E785 Hyperlipidemia, unspecified: Secondary | ICD-10-CM | POA: Diagnosis not present

## 2016-11-21 DIAGNOSIS — E669 Obesity, unspecified: Secondary | ICD-10-CM | POA: Diagnosis not present

## 2016-11-21 DIAGNOSIS — Z1389 Encounter for screening for other disorder: Secondary | ICD-10-CM | POA: Diagnosis not present

## 2016-11-21 DIAGNOSIS — Z6833 Body mass index (BMI) 33.0-33.9, adult: Secondary | ICD-10-CM | POA: Diagnosis not present

## 2016-11-21 DIAGNOSIS — Z9981 Dependence on supplemental oxygen: Secondary | ICD-10-CM | POA: Diagnosis not present

## 2016-11-21 DIAGNOSIS — M25511 Pain in right shoulder: Secondary | ICD-10-CM | POA: Diagnosis not present

## 2016-12-11 DIAGNOSIS — R42 Dizziness and giddiness: Secondary | ICD-10-CM | POA: Diagnosis not present

## 2016-12-11 DIAGNOSIS — Z6832 Body mass index (BMI) 32.0-32.9, adult: Secondary | ICD-10-CM | POA: Diagnosis not present

## 2016-12-11 DIAGNOSIS — E669 Obesity, unspecified: Secondary | ICD-10-CM | POA: Diagnosis not present

## 2016-12-11 DIAGNOSIS — M25511 Pain in right shoulder: Secondary | ICD-10-CM | POA: Diagnosis not present

## 2016-12-13 DIAGNOSIS — H353133 Nonexudative age-related macular degeneration, bilateral, advanced atrophic without subfoveal involvement: Secondary | ICD-10-CM | POA: Diagnosis not present

## 2016-12-21 DIAGNOSIS — Z789 Other specified health status: Secondary | ICD-10-CM | POA: Diagnosis not present

## 2016-12-21 DIAGNOSIS — M79641 Pain in right hand: Secondary | ICD-10-CM | POA: Diagnosis not present

## 2016-12-21 DIAGNOSIS — M79642 Pain in left hand: Secondary | ICD-10-CM | POA: Diagnosis not present

## 2016-12-21 DIAGNOSIS — Z139 Encounter for screening, unspecified: Secondary | ICD-10-CM | POA: Diagnosis not present

## 2016-12-21 DIAGNOSIS — Z6833 Body mass index (BMI) 33.0-33.9, adult: Secondary | ICD-10-CM | POA: Diagnosis not present

## 2016-12-21 DIAGNOSIS — M7989 Other specified soft tissue disorders: Secondary | ICD-10-CM | POA: Diagnosis not present

## 2016-12-24 DIAGNOSIS — Z0000 Encounter for general adult medical examination without abnormal findings: Secondary | ICD-10-CM | POA: Diagnosis not present

## 2016-12-24 DIAGNOSIS — M25511 Pain in right shoulder: Secondary | ICD-10-CM | POA: Diagnosis not present

## 2016-12-24 DIAGNOSIS — Z6832 Body mass index (BMI) 32.0-32.9, adult: Secondary | ICD-10-CM | POA: Diagnosis not present

## 2016-12-24 DIAGNOSIS — E669 Obesity, unspecified: Secondary | ICD-10-CM | POA: Diagnosis not present

## 2016-12-24 DIAGNOSIS — Z9981 Dependence on supplemental oxygen: Secondary | ICD-10-CM | POA: Diagnosis not present

## 2017-01-07 DIAGNOSIS — Z6831 Body mass index (BMI) 31.0-31.9, adult: Secondary | ICD-10-CM | POA: Diagnosis not present

## 2017-01-07 DIAGNOSIS — E669 Obesity, unspecified: Secondary | ICD-10-CM | POA: Diagnosis not present

## 2017-01-07 DIAGNOSIS — M2550 Pain in unspecified joint: Secondary | ICD-10-CM | POA: Diagnosis not present

## 2017-01-07 DIAGNOSIS — E559 Vitamin D deficiency, unspecified: Secondary | ICD-10-CM | POA: Diagnosis not present

## 2017-01-28 DIAGNOSIS — Z6831 Body mass index (BMI) 31.0-31.9, adult: Secondary | ICD-10-CM | POA: Diagnosis not present

## 2017-01-28 DIAGNOSIS — E669 Obesity, unspecified: Secondary | ICD-10-CM | POA: Diagnosis not present

## 2017-01-28 DIAGNOSIS — M1990 Unspecified osteoarthritis, unspecified site: Secondary | ICD-10-CM | POA: Diagnosis not present

## 2017-02-05 DIAGNOSIS — G4733 Obstructive sleep apnea (adult) (pediatric): Secondary | ICD-10-CM | POA: Diagnosis not present

## 2017-02-05 DIAGNOSIS — R942 Abnormal results of pulmonary function studies: Secondary | ICD-10-CM | POA: Diagnosis not present

## 2017-02-05 DIAGNOSIS — J449 Chronic obstructive pulmonary disease, unspecified: Secondary | ICD-10-CM | POA: Diagnosis not present

## 2017-02-13 DIAGNOSIS — R5383 Other fatigue: Secondary | ICD-10-CM | POA: Diagnosis not present

## 2017-02-13 DIAGNOSIS — E669 Obesity, unspecified: Secondary | ICD-10-CM | POA: Diagnosis not present

## 2017-02-13 DIAGNOSIS — M7989 Other specified soft tissue disorders: Secondary | ICD-10-CM | POA: Diagnosis not present

## 2017-02-13 DIAGNOSIS — J449 Chronic obstructive pulmonary disease, unspecified: Secondary | ICD-10-CM | POA: Diagnosis not present

## 2017-02-13 DIAGNOSIS — Z6831 Body mass index (BMI) 31.0-31.9, adult: Secondary | ICD-10-CM | POA: Diagnosis not present

## 2017-02-13 DIAGNOSIS — R768 Other specified abnormal immunological findings in serum: Secondary | ICD-10-CM | POA: Diagnosis not present

## 2017-02-13 DIAGNOSIS — M0589 Other rheumatoid arthritis with rheumatoid factor of multiple sites: Secondary | ICD-10-CM | POA: Diagnosis not present

## 2017-02-13 DIAGNOSIS — M2550 Pain in unspecified joint: Secondary | ICD-10-CM | POA: Diagnosis not present

## 2017-03-27 DIAGNOSIS — Z6832 Body mass index (BMI) 32.0-32.9, adult: Secondary | ICD-10-CM | POA: Diagnosis not present

## 2017-03-27 DIAGNOSIS — M0589 Other rheumatoid arthritis with rheumatoid factor of multiple sites: Secondary | ICD-10-CM | POA: Diagnosis not present

## 2017-03-27 DIAGNOSIS — R768 Other specified abnormal immunological findings in serum: Secondary | ICD-10-CM | POA: Diagnosis not present

## 2017-03-27 DIAGNOSIS — J449 Chronic obstructive pulmonary disease, unspecified: Secondary | ICD-10-CM | POA: Diagnosis not present

## 2017-03-27 DIAGNOSIS — M7989 Other specified soft tissue disorders: Secondary | ICD-10-CM | POA: Diagnosis not present

## 2017-03-27 DIAGNOSIS — R5383 Other fatigue: Secondary | ICD-10-CM | POA: Diagnosis not present

## 2017-03-27 DIAGNOSIS — E669 Obesity, unspecified: Secondary | ICD-10-CM | POA: Diagnosis not present

## 2017-03-27 DIAGNOSIS — M2550 Pain in unspecified joint: Secondary | ICD-10-CM | POA: Diagnosis not present

## 2017-04-19 DIAGNOSIS — H353134 Nonexudative age-related macular degeneration, bilateral, advanced atrophic with subfoveal involvement: Secondary | ICD-10-CM | POA: Diagnosis not present

## 2017-04-19 DIAGNOSIS — H25813 Combined forms of age-related cataract, bilateral: Secondary | ICD-10-CM | POA: Diagnosis not present

## 2017-04-23 DIAGNOSIS — J449 Chronic obstructive pulmonary disease, unspecified: Secondary | ICD-10-CM | POA: Diagnosis not present

## 2017-04-23 DIAGNOSIS — G4733 Obstructive sleep apnea (adult) (pediatric): Secondary | ICD-10-CM | POA: Diagnosis not present

## 2017-05-01 DIAGNOSIS — R739 Hyperglycemia, unspecified: Secondary | ICD-10-CM | POA: Diagnosis not present

## 2017-05-01 DIAGNOSIS — M1990 Unspecified osteoarthritis, unspecified site: Secondary | ICD-10-CM | POA: Diagnosis not present

## 2017-05-01 DIAGNOSIS — E669 Obesity, unspecified: Secondary | ICD-10-CM | POA: Diagnosis not present

## 2017-05-01 DIAGNOSIS — Z1389 Encounter for screening for other disorder: Secondary | ICD-10-CM | POA: Diagnosis not present

## 2017-05-01 DIAGNOSIS — E785 Hyperlipidemia, unspecified: Secondary | ICD-10-CM | POA: Diagnosis not present

## 2017-05-01 DIAGNOSIS — J449 Chronic obstructive pulmonary disease, unspecified: Secondary | ICD-10-CM | POA: Diagnosis not present

## 2017-05-28 DIAGNOSIS — Z6833 Body mass index (BMI) 33.0-33.9, adult: Secondary | ICD-10-CM | POA: Diagnosis not present

## 2017-05-28 DIAGNOSIS — R5383 Other fatigue: Secondary | ICD-10-CM | POA: Diagnosis not present

## 2017-05-28 DIAGNOSIS — M2550 Pain in unspecified joint: Secondary | ICD-10-CM | POA: Diagnosis not present

## 2017-05-28 DIAGNOSIS — M7989 Other specified soft tissue disorders: Secondary | ICD-10-CM | POA: Diagnosis not present

## 2017-05-28 DIAGNOSIS — M0589 Other rheumatoid arthritis with rheumatoid factor of multiple sites: Secondary | ICD-10-CM | POA: Diagnosis not present

## 2017-05-28 DIAGNOSIS — Z79899 Other long term (current) drug therapy: Secondary | ICD-10-CM | POA: Diagnosis not present

## 2017-05-28 DIAGNOSIS — K921 Melena: Secondary | ICD-10-CM | POA: Diagnosis not present

## 2017-05-28 DIAGNOSIS — R768 Other specified abnormal immunological findings in serum: Secondary | ICD-10-CM | POA: Diagnosis not present

## 2017-05-28 DIAGNOSIS — J449 Chronic obstructive pulmonary disease, unspecified: Secondary | ICD-10-CM | POA: Diagnosis not present

## 2017-05-28 DIAGNOSIS — E669 Obesity, unspecified: Secondary | ICD-10-CM | POA: Diagnosis not present

## 2017-06-20 DIAGNOSIS — H353133 Nonexudative age-related macular degeneration, bilateral, advanced atrophic without subfoveal involvement: Secondary | ICD-10-CM | POA: Diagnosis not present

## 2017-07-03 DIAGNOSIS — R609 Edema, unspecified: Secondary | ICD-10-CM | POA: Diagnosis not present

## 2017-07-03 DIAGNOSIS — M069 Rheumatoid arthritis, unspecified: Secondary | ICD-10-CM | POA: Diagnosis not present

## 2017-07-03 DIAGNOSIS — M79601 Pain in right arm: Secondary | ICD-10-CM | POA: Diagnosis not present

## 2017-07-03 DIAGNOSIS — Z713 Dietary counseling and surveillance: Secondary | ICD-10-CM | POA: Diagnosis not present

## 2017-07-03 DIAGNOSIS — Z6833 Body mass index (BMI) 33.0-33.9, adult: Secondary | ICD-10-CM | POA: Diagnosis not present

## 2017-07-09 DIAGNOSIS — M79601 Pain in right arm: Secondary | ICD-10-CM | POA: Diagnosis not present

## 2017-07-10 ENCOUNTER — Emergency Department (HOSPITAL_COMMUNITY)
Admission: EM | Admit: 2017-07-10 | Discharge: 2017-07-10 | Disposition: A | Discharge status: Home / Self Care | Payer: Medicare Other | Attending: Emergency Medicine | Admitting: Emergency Medicine

## 2017-07-10 ENCOUNTER — Emergency Department (HOSPITAL_COMMUNITY): Payer: Medicare Other

## 2017-07-10 ENCOUNTER — Other Ambulatory Visit: Payer: Self-pay

## 2017-07-10 ENCOUNTER — Encounter (HOSPITAL_COMMUNITY): Payer: Self-pay

## 2017-07-10 DIAGNOSIS — M79601 Pain in right arm: Secondary | ICD-10-CM

## 2017-07-10 DIAGNOSIS — Z79899 Other long term (current) drug therapy: Secondary | ICD-10-CM | POA: Diagnosis not present

## 2017-07-10 DIAGNOSIS — M50121 Cervical disc disorder at C4-C5 level with radiculopathy: Secondary | ICD-10-CM | POA: Diagnosis not present

## 2017-07-10 DIAGNOSIS — M5127 Other intervertebral disc displacement, lumbosacral region: Secondary | ICD-10-CM | POA: Diagnosis not present

## 2017-07-10 DIAGNOSIS — M50221 Other cervical disc displacement at C4-C5 level: Secondary | ICD-10-CM

## 2017-07-10 DIAGNOSIS — J449 Chronic obstructive pulmonary disease, unspecified: Secondary | ICD-10-CM | POA: Diagnosis not present

## 2017-07-10 DIAGNOSIS — Z87891 Personal history of nicotine dependence: Secondary | ICD-10-CM | POA: Diagnosis not present

## 2017-07-10 DIAGNOSIS — R200 Anesthesia of skin: Secondary | ICD-10-CM | POA: Diagnosis not present

## 2017-07-10 HISTORY — DX: Unspecified hearing loss, unspecified ear: H91.90

## 2017-07-10 HISTORY — DX: Unspecified cataract: H26.9

## 2017-07-10 HISTORY — DX: Chronic obstructive pulmonary disease, unspecified: J44.9

## 2017-07-10 HISTORY — DX: Rheumatoid arthritis, unspecified: M06.9

## 2017-07-10 HISTORY — DX: Unspecified macular degeneration: H35.30

## 2017-07-10 LAB — CBC WITH DIFFERENTIAL/PLATELET
BASOS ABS: 0.1 10*3/uL (ref 0.0–0.1)
Basophils Relative: 1 %
EOS ABS: 0.5 10*3/uL (ref 0.0–0.7)
EOS PCT: 6 %
HCT: 41.8 % (ref 39.0–52.0)
Hemoglobin: 13.3 g/dL (ref 13.0–17.0)
LYMPHS PCT: 29 %
Lymphs Abs: 2.6 10*3/uL (ref 0.7–4.0)
MCH: 31.4 pg (ref 26.0–34.0)
MCHC: 31.8 g/dL (ref 30.0–36.0)
MCV: 98.8 fL (ref 78.0–100.0)
Monocytes Absolute: 0.7 10*3/uL (ref 0.1–1.0)
Monocytes Relative: 8 %
Neutro Abs: 5.2 10*3/uL (ref 1.7–7.7)
Neutrophils Relative %: 58 %
PLATELETS: 315 10*3/uL (ref 150–400)
RBC: 4.23 MIL/uL (ref 4.22–5.81)
RDW: 14.5 % (ref 11.5–15.5)
WBC: 9 10*3/uL (ref 4.0–10.5)

## 2017-07-10 LAB — COMPREHENSIVE METABOLIC PANEL
ALT: 20 U/L (ref 17–63)
AST: 25 U/L (ref 15–41)
Albumin: 3.7 g/dL (ref 3.5–5.0)
Alkaline Phosphatase: 77 U/L (ref 38–126)
Anion gap: 10 (ref 5–15)
BUN: 13 mg/dL (ref 6–20)
CHLORIDE: 104 mmol/L (ref 101–111)
CO2: 27 mmol/L (ref 22–32)
CREATININE: 0.76 mg/dL (ref 0.61–1.24)
Calcium: 8.9 mg/dL (ref 8.9–10.3)
GFR calc Af Amer: >60 mL/min (ref >60)
GFR calc non Af Amer: >60 mL/min (ref >60)
Glucose, Bld: 92 mg/dL (ref 65–99)
Potassium: 3.4 mmol/L — ABNORMAL LOW (ref 3.5–5.1)
SODIUM: 141 mmol/L (ref 135–145)
Total Bilirubin: 0.3 mg/dL (ref 0.3–1.2)
Total Protein: 7.4 g/dL (ref 6.5–8.1)

## 2017-07-10 LAB — TROPONIN I: Troponin I: <0.03 ng/mL (ref <0.03)

## 2017-07-10 MED ORDER — HYDROMORPHONE HCL 1 MG/ML IJ SOLN
1.0000 mg | Freq: Once | INTRAMUSCULAR | Status: AC
  Administered 2017-07-10: 1 mg via INTRAMUSCULAR
  Filled 2017-07-10: qty 1

## 2017-07-10 MED ORDER — OXYCODONE-ACETAMINOPHEN 5-325 MG PO TABS: 1.0000 | ORAL_TABLET | ORAL | 0 refills | Status: DC | PRN

## 2017-07-10 MED ORDER — PREDNISONE 10 MG PO TABS: 10.0000 mg | ORAL_TABLET | Freq: Every day | ORAL | 0 refills | Status: DC

## 2017-07-10 MED ORDER — ZOLPIDEM TARTRATE 5 MG PO TABS
5.0000 mg | ORAL_TABLET | Freq: Once | ORAL | Status: AC
  Administered 2017-07-10: 5 mg via ORAL
  Filled 2017-07-10: qty 1

## 2017-07-10 NOTE — Discharge Instructions (Signed)
CT scan of your neck shows abnormal findings which suggests that the nerve exiting your neck has been irritated, causing pain in your right arm.  Prescription for pain medicine and prednisone.  You will likely need an MRI scan of your neck at home.  Your primary care doctor can organize this.

## 2017-07-10 NOTE — ED Provider Notes (Addendum)
[facility] EMERGENCY DEPARTMENT Provider Note   CSN: [id number] Arrival date & time: 07/10/17  1210     History   Chief Complaint Chief Complaint  Patient presents with  . Numbness    HPI Martin Chauhan is a 72 y.o. male.  Patient reports numbness and tingling in the right arm since this past summer getting worse recently.  No chest pain, dyspnea, diaphoresis, nausea.  Past medical history includes rheumatoid arthritis, COPD, macular degeneration, hearing loss.  He has had no official workup for neck patholgy.      Past Medical History:  Diagnosis Date  . Cataracts, bilateral   . COPD (chronic obstructive pulmonary disease) (HCC)    chronic O2 4 L  . Hearing loss   . Macular degeneration   . Rheumatoid arthritis (HCC)     There are no active problems to display for this patient.   Past Surgical History:  Procedure Laterality Date  . LUNG BIOPSY         Home Medications    Prior to Admission medications   Medication Sig Start Date End Date Taking? Authorizing Provider  Artificial Tear Ointment (LUBRICANT EYE OP) Apply 1-2 drops to eye daily. Lubricant Eye gel   Yes [provider]  folic acid (FOLVITE) 1 MG tablet Take 1 mg by mouth daily. 05/01/17  Yes [provider]  furosemide (LASIX) 20 MG tablet Take 20 mg by mouth daily. 07/03/17  Yes [provider]  methotrexate (RHEUMATREX) 2.5 MG tablet Take 10 tablets by mouth every Monday.  07/09/17  Yes [provider]  Multiple Vitamins-Minerals (PRESERVISION AREDS 2+MULTI VIT) CAPS Take 1 capsule by mouth 2 (two) times daily.   Yes [provider]  naproxen sodium (ALEVE) 220 MG tablet Take 220-440 mg by mouth daily as needed (for pain).   Yes [provider]  Omega-3 Fatty Acids (FISH OIL) 1000 MG CAPS Take 1 capsule by mouth 2 (two) times daily.   Yes [provider]  oxyCODONE-acetaminophen (PERCOCET) 5-325 MG tablet Take 1-2 tablets by mouth every 4  (four) hours as needed. 07/10/17   Donnetta Hutching, MD  predniSONE (DELTASONE) 10 MG tablet Take 1 tablet (10 mg total) by mouth daily with breakfast. 3 tablets for 4 days, 2 tablets for 4 days, 1 tablet for 4 days 07/10/17   Donnetta Hutching, MD    Family History No family history on file.  Social History Social History   Tobacco Use  . Smoking status: Former Smoker  Substance Use Topics  . Alcohol use: No    Frequency: Never  . Drug use: No     Allergies   Patient has no known allergies.   Review of Systems Review of Systems  All other systems reviewed and are negative.    Physical Exam Updated Vital Signs BP 133/86   Pulse 82   Temp 98.1 F (36.7 C) (Temporal)   Resp 17   Ht 5\' 11"  (1.803 m)   Wt 108.4 kg (239 lb)   SpO2 98%   BMI 33.33 kg/m   Physical Exam  Constitutional: He is oriented to person, place, and time. He appears well-developed and well-nourished.  HENT:  Head: Normocephalic and atraumatic.  Eyes: Conjunctivae are normal.  Neck: Neck supple.  Cardiovascular: Normal rate and regular rhythm.  Pulmonary/Chest: Effort normal and breath sounds normal.  Abdominal: Soft. Bowel sounds are normal.  Musculoskeletal: Normal range of motion.  Neurological: He is alert and oriented to person, place, and time.  Full range of motion of right upper extremity.  Hand is pink and warm.  Skin: Skin is warm and dry.  Psychiatric: He has a normal mood and affect. His behavior is normal.  Nursing note and vitals reviewed.    ED Treatments / Results  Labs (all labs ordered are listed, but only abnormal results are displayed) Labs Reviewed  COMPREHENSIVE METABOLIC PANEL - Abnormal; Notable for the following components:      Result Value   Potassium 3.4 (*)    All other components within normal limits  CBC WITH DIFFERENTIAL/PLATELET  TROPONIN I    EKG  EKG Interpretation None       Radiology Ct Head Wo Contrast  Result Date: 07/10/2017 CLINICAL DATA:  Right  arm pain and numbness for the past 2 weeks. EXAM: CT HEAD WITHOUT CONTRAST CT CERVICAL SPINE WITHOUT CONTRAST TECHNIQUE: Multidetector CT imaging of the head and cervical spine was performed following the standard protocol without intravenous contrast. Multiplanar CT image reconstructions of the cervical spine were also generated. COMPARISON:  None. FINDINGS: CT HEAD FINDINGS Brain: No evidence of acute infarction, hemorrhage, hydrocephalus, extra-axial collection or mass lesion/mass effect. Mild generalized cerebral atrophy. Vascular: No hyperdense vessel or unexpected calcification. Skull: Normal. Negative for fracture or focal lesion. Sinuses/Orbits: No acute finding. Other: None. CT CERVICAL SPINE FINDINGS Alignment: Straightening of the normal cervical lordosis is maintained. Sagittal alignment. Skull base and vertebrae: No acute fracture. No primary bone lesion or focal pathologic process. Soft tissues and spinal canal: No prevertebral fluid or swelling. No visible canal hematoma. Disc levels: Prominent atlantodental degenerative changes. Focal central disc protrusion at C4-C5 indenting the ventral thecal sac. Moderate right neuroforaminal stenosis at C5-C6 due to posterior disc osteophyte complex and uncovertebral hypertrophy. Upper chest: Negative. Other: Ossification of the nuchal ligament. IMPRESSION: 1.  No acute intracranial abnormality. 2. Focal central disc protrusion at C4-C5 indenting the ventral thecal sac. 3. Moderate right neuroforaminal stenosis at C5-C6 due to posterior disc osteophyte complex and uncovertebral hypertrophy, which could impinge on the exiting right C6 nerve root and account for the patient's symptoms. Electronically Signed   By: Obie Dredge M.D.   On: 07/10/2017 17:27   Ct Cervical Spine Wo Contrast  Result Date: 07/10/2017 CLINICAL DATA:  Right arm pain and numbness for the past 2 weeks. EXAM: CT HEAD WITHOUT CONTRAST CT CERVICAL SPINE WITHOUT CONTRAST TECHNIQUE:  Multidetector CT imaging of the head and cervical spine was performed following the standard protocol without intravenous contrast. Multiplanar CT image reconstructions of the cervical spine were also generated. COMPARISON:  None. FINDINGS: CT HEAD FINDINGS Brain: No evidence of acute infarction, hemorrhage, hydrocephalus, extra-axial collection or mass lesion/mass effect. Mild generalized cerebral atrophy. Vascular: No hyperdense vessel or unexpected calcification. Skull: Normal. Negative for fracture or focal lesion. Sinuses/Orbits: No acute finding. Other: None. CT CERVICAL SPINE FINDINGS Alignment: Straightening of the normal cervical lordosis is maintained. Sagittal alignment. Skull base and vertebrae: No acute fracture. No primary bone lesion or focal pathologic process. Soft tissues and spinal canal: No prevertebral fluid or swelling. No visible canal hematoma. Disc levels: Prominent atlantodental degenerative changes. Focal central disc protrusion at C4-C5 indenting the ventral thecal sac. Moderate right neuroforaminal stenosis at C5-C6 due to posterior disc osteophyte complex and uncovertebral hypertrophy. Upper chest: Negative. Other: Ossification of the nuchal ligament. IMPRESSION: 1.  No acute intracranial abnormality. 2. Focal central disc protrusion at C4-C5 indenting the ventral thecal sac. 3. Moderate right neuroforaminal stenosis at C5-C6 due to posterior disc  osteophyte complex and uncovertebral hypertrophy, which could impinge on the exiting right C6 nerve root and account for the patient's symptoms. Electronically Signed   By: Obie Dredge M.D.   On: 07/10/2017 17:27    Procedures Procedures (including critical care time)  Medications Ordered in ED Medications  zolpidem (AMBIEN) tablet 5 mg (not administered)  HYDROmorphone (DILAUDID) injection 1 mg (1 mg Intramuscular Given 07/10/17 1636)     Initial Impression / Assessment and Plan / ED Course  I have reviewed the triage vital  signs and the nursing notes.  Pertinent labs & imaging results that were available during my care of the patient were reviewed by me and considered in my medical decision making (see chart for details).     Cervical CT reveals a focal central disc protrusion at C4/C5 indenting the ventral thecal sac.  Additionally there is moderate right neuroforaminal stenosis at C5/C6.  These findings were discussed with the patient and his wife.  He will follow-up with his primary care doctor at home.  He understands he will most likely need an MRI at home.  He was given Dilaudid 1 mg IM in the ED.  Discharge medications Percocet and prednisone.  Final Clinical Impressions(s) / ED Diagnoses   Final diagnoses:  Right arm pain  Herniated nucleus pulposus, C4-5    ED Discharge Orders        Ordered    oxyCODONE-acetaminophen (PERCOCET) 5-325 MG tablet  Every 4 hours PRN     07/10/17 2047    predniSONE (DELTASONE) 10 MG tablet  Daily with breakfast     07/10/17 2047       Donnetta Hutching, MD 07/10/17 2229    Donnetta Hutching, MD 07/10/17 2229

## 2017-07-10 NOTE — ED Triage Notes (Addendum)
Pt reports that right hand and arm have been numb and burning since sat. Pt reports that he went to [facility] yesterday and was given 2 shots and was told to come to ED if no improvement. Pt reports pain increases with sitting

## 2017-07-18 DIAGNOSIS — Z6832 Body mass index (BMI) 32.0-32.9, adult: Secondary | ICD-10-CM | POA: Diagnosis not present

## 2017-07-18 DIAGNOSIS — Z713 Dietary counseling and surveillance: Secondary | ICD-10-CM | POA: Diagnosis not present

## 2017-07-18 DIAGNOSIS — M5020 Other cervical disc displacement, unspecified cervical region: Secondary | ICD-10-CM | POA: Diagnosis not present

## 2017-07-18 DIAGNOSIS — M542 Cervicalgia: Secondary | ICD-10-CM | POA: Diagnosis not present

## 2017-07-18 DIAGNOSIS — E669 Obesity, unspecified: Secondary | ICD-10-CM | POA: Diagnosis not present

## 2017-07-19 DIAGNOSIS — M542 Cervicalgia: Secondary | ICD-10-CM | POA: Diagnosis not present

## 2017-07-19 DIAGNOSIS — M5020 Other cervical disc displacement, unspecified cervical region: Secondary | ICD-10-CM | POA: Diagnosis not present

## 2017-07-23 DIAGNOSIS — H2513 Age-related nuclear cataract, bilateral: Secondary | ICD-10-CM | POA: Diagnosis not present

## 2017-07-23 DIAGNOSIS — H353134 Nonexudative age-related macular degeneration, bilateral, advanced atrophic with subfoveal involvement: Secondary | ICD-10-CM | POA: Diagnosis not present

## 2017-07-31 DIAGNOSIS — Z6832 Body mass index (BMI) 32.0-32.9, adult: Secondary | ICD-10-CM | POA: Diagnosis not present

## 2017-07-31 DIAGNOSIS — E669 Obesity, unspecified: Secondary | ICD-10-CM | POA: Diagnosis not present

## 2017-07-31 DIAGNOSIS — M2550 Pain in unspecified joint: Secondary | ICD-10-CM | POA: Diagnosis not present

## 2017-07-31 DIAGNOSIS — M0589 Other rheumatoid arthritis with rheumatoid factor of multiple sites: Secondary | ICD-10-CM | POA: Diagnosis not present

## 2017-07-31 DIAGNOSIS — M5030 Other cervical disc degeneration, unspecified cervical region: Secondary | ICD-10-CM | POA: Diagnosis not present

## 2017-07-31 DIAGNOSIS — R5383 Other fatigue: Secondary | ICD-10-CM | POA: Diagnosis not present

## 2017-07-31 DIAGNOSIS — J449 Chronic obstructive pulmonary disease, unspecified: Secondary | ICD-10-CM | POA: Diagnosis not present

## 2017-09-01 DIAGNOSIS — J8410 Pulmonary fibrosis, unspecified: Secondary | ICD-10-CM | POA: Diagnosis not present

## 2017-09-01 DIAGNOSIS — Z66 Do not resuscitate: Secondary | ICD-10-CM | POA: Diagnosis not present

## 2017-09-01 DIAGNOSIS — J849 Interstitial pulmonary disease, unspecified: Secondary | ICD-10-CM | POA: Diagnosis not present

## 2017-09-01 DIAGNOSIS — E860 Dehydration: Secondary | ICD-10-CM | POA: Diagnosis not present

## 2017-09-01 DIAGNOSIS — R05 Cough: Secondary | ICD-10-CM | POA: Diagnosis not present

## 2017-09-01 DIAGNOSIS — J9621 Acute and chronic respiratory failure with hypoxia: Secondary | ICD-10-CM | POA: Diagnosis not present

## 2017-09-01 DIAGNOSIS — J449 Chronic obstructive pulmonary disease, unspecified: Secondary | ICD-10-CM | POA: Diagnosis not present

## 2017-09-01 DIAGNOSIS — J9601 Acute respiratory failure with hypoxia: Secondary | ICD-10-CM | POA: Diagnosis not present

## 2017-09-01 DIAGNOSIS — R000 Tachycardia, unspecified: Secondary | ICD-10-CM | POA: Diagnosis not present

## 2017-09-01 DIAGNOSIS — M069 Rheumatoid arthritis, unspecified: Secondary | ICD-10-CM | POA: Diagnosis not present

## 2017-09-01 DIAGNOSIS — A084 Viral intestinal infection, unspecified: Secondary | ICD-10-CM | POA: Diagnosis not present

## 2017-09-01 DIAGNOSIS — G4733 Obstructive sleep apnea (adult) (pediatric): Secondary | ICD-10-CM | POA: Diagnosis not present

## 2017-09-01 DIAGNOSIS — Z0289 Encounter for other administrative examinations: Secondary | ICD-10-CM | POA: Diagnosis not present

## 2017-09-01 DIAGNOSIS — R110 Nausea: Secondary | ICD-10-CM | POA: Diagnosis not present

## 2017-09-02 DIAGNOSIS — J441 Chronic obstructive pulmonary disease with (acute) exacerbation: Secondary | ICD-10-CM | POA: Diagnosis not present

## 2017-09-02 DIAGNOSIS — R110 Nausea: Secondary | ICD-10-CM | POA: Diagnosis not present

## 2017-09-02 DIAGNOSIS — J9601 Acute respiratory failure with hypoxia: Secondary | ICD-10-CM | POA: Diagnosis not present

## 2017-09-02 DIAGNOSIS — J8410 Pulmonary fibrosis, unspecified: Secondary | ICD-10-CM | POA: Diagnosis present

## 2017-09-02 DIAGNOSIS — M069 Rheumatoid arthritis, unspecified: Secondary | ICD-10-CM | POA: Diagnosis present

## 2017-09-02 DIAGNOSIS — Z66 Do not resuscitate: Secondary | ICD-10-CM | POA: Diagnosis present

## 2017-09-02 DIAGNOSIS — A084 Viral intestinal infection, unspecified: Secondary | ICD-10-CM | POA: Diagnosis present

## 2017-09-02 DIAGNOSIS — Z87891 Personal history of nicotine dependence: Secondary | ICD-10-CM | POA: Diagnosis not present

## 2017-09-02 DIAGNOSIS — G4733 Obstructive sleep apnea (adult) (pediatric): Secondary | ICD-10-CM | POA: Diagnosis present

## 2017-09-02 DIAGNOSIS — J9622 Acute and chronic respiratory failure with hypercapnia: Secondary | ICD-10-CM | POA: Diagnosis not present

## 2017-09-02 DIAGNOSIS — R000 Tachycardia, unspecified: Secondary | ICD-10-CM | POA: Diagnosis present

## 2017-09-02 DIAGNOSIS — J9621 Acute and chronic respiratory failure with hypoxia: Secondary | ICD-10-CM | POA: Diagnosis present

## 2017-09-02 DIAGNOSIS — Z9981 Dependence on supplemental oxygen: Secondary | ICD-10-CM | POA: Diagnosis not present

## 2017-09-02 DIAGNOSIS — J449 Chronic obstructive pulmonary disease, unspecified: Secondary | ICD-10-CM | POA: Diagnosis present

## 2017-09-02 DIAGNOSIS — E860 Dehydration: Secondary | ICD-10-CM | POA: Diagnosis present

## 2017-09-10 DIAGNOSIS — J84112 Idiopathic pulmonary fibrosis: Secondary | ICD-10-CM | POA: Diagnosis not present

## 2017-09-10 DIAGNOSIS — R0902 Hypoxemia: Secondary | ICD-10-CM | POA: Diagnosis not present

## 2017-09-10 DIAGNOSIS — J849 Interstitial pulmonary disease, unspecified: Secondary | ICD-10-CM | POA: Diagnosis not present

## 2017-09-11 DIAGNOSIS — J449 Chronic obstructive pulmonary disease, unspecified: Secondary | ICD-10-CM | POA: Diagnosis not present

## 2017-09-11 DIAGNOSIS — R030 Elevated blood-pressure reading, without diagnosis of hypertension: Secondary | ICD-10-CM | POA: Diagnosis not present

## 2017-09-11 DIAGNOSIS — Z9981 Dependence on supplemental oxygen: Secondary | ICD-10-CM | POA: Diagnosis not present

## 2017-09-11 DIAGNOSIS — G4700 Insomnia, unspecified: Secondary | ICD-10-CM | POA: Diagnosis not present

## 2017-10-28 DIAGNOSIS — Z6832 Body mass index (BMI) 32.0-32.9, adult: Secondary | ICD-10-CM | POA: Diagnosis not present

## 2017-10-28 DIAGNOSIS — M5030 Other cervical disc degeneration, unspecified cervical region: Secondary | ICD-10-CM | POA: Diagnosis not present

## 2017-10-28 DIAGNOSIS — M0589 Other rheumatoid arthritis with rheumatoid factor of multiple sites: Secondary | ICD-10-CM | POA: Diagnosis not present

## 2017-10-28 DIAGNOSIS — R5383 Other fatigue: Secondary | ICD-10-CM | POA: Diagnosis not present

## 2017-10-28 DIAGNOSIS — E669 Obesity, unspecified: Secondary | ICD-10-CM | POA: Diagnosis not present

## 2017-10-28 DIAGNOSIS — M2550 Pain in unspecified joint: Secondary | ICD-10-CM | POA: Diagnosis not present

## 2017-10-28 DIAGNOSIS — J8410 Pulmonary fibrosis, unspecified: Secondary | ICD-10-CM | POA: Diagnosis not present

## 2017-10-30 DIAGNOSIS — E782 Mixed hyperlipidemia: Secondary | ICD-10-CM | POA: Diagnosis not present

## 2017-10-30 DIAGNOSIS — R030 Elevated blood-pressure reading, without diagnosis of hypertension: Secondary | ICD-10-CM | POA: Diagnosis not present

## 2017-10-30 DIAGNOSIS — R7309 Other abnormal glucose: Secondary | ICD-10-CM | POA: Diagnosis not present

## 2017-10-30 DIAGNOSIS — J449 Chronic obstructive pulmonary disease, unspecified: Secondary | ICD-10-CM | POA: Diagnosis not present

## 2017-10-31 DIAGNOSIS — E663 Overweight: Secondary | ICD-10-CM | POA: Diagnosis not present

## 2017-10-31 DIAGNOSIS — J84112 Idiopathic pulmonary fibrosis: Secondary | ICD-10-CM | POA: Diagnosis not present

## 2017-10-31 DIAGNOSIS — R0902 Hypoxemia: Secondary | ICD-10-CM | POA: Diagnosis not present

## 2017-10-31 DIAGNOSIS — J449 Chronic obstructive pulmonary disease, unspecified: Secondary | ICD-10-CM | POA: Diagnosis not present

## 2017-11-11 DIAGNOSIS — J8410 Pulmonary fibrosis, unspecified: Secondary | ICD-10-CM | POA: Diagnosis not present

## 2017-11-11 DIAGNOSIS — M069 Rheumatoid arthritis, unspecified: Secondary | ICD-10-CM | POA: Diagnosis not present

## 2017-11-11 DIAGNOSIS — Z1389 Encounter for screening for other disorder: Secondary | ICD-10-CM | POA: Diagnosis not present

## 2017-11-11 DIAGNOSIS — R7303 Prediabetes: Secondary | ICD-10-CM | POA: Diagnosis not present

## 2017-11-11 DIAGNOSIS — I10 Essential (primary) hypertension: Secondary | ICD-10-CM | POA: Diagnosis not present

## 2017-12-19 DIAGNOSIS — H33312 Horseshoe tear of retina without detachment, left eye: Secondary | ICD-10-CM | POA: Diagnosis not present

## 2017-12-24 DIAGNOSIS — Z8669 Personal history of other diseases of the nervous system and sense organs: Secondary | ICD-10-CM | POA: Diagnosis not present

## 2017-12-24 DIAGNOSIS — H353134 Nonexudative age-related macular degeneration, bilateral, advanced atrophic with subfoveal involvement: Secondary | ICD-10-CM | POA: Diagnosis not present

## 2017-12-24 DIAGNOSIS — H25813 Combined forms of age-related cataract, bilateral: Secondary | ICD-10-CM | POA: Diagnosis not present

## 2018-01-01 DIAGNOSIS — M069 Rheumatoid arthritis, unspecified: Secondary | ICD-10-CM | POA: Diagnosis not present

## 2018-01-01 DIAGNOSIS — J449 Chronic obstructive pulmonary disease, unspecified: Secondary | ICD-10-CM | POA: Diagnosis not present

## 2018-01-01 DIAGNOSIS — R0902 Hypoxemia: Secondary | ICD-10-CM | POA: Diagnosis not present

## 2018-01-01 DIAGNOSIS — J84112 Idiopathic pulmonary fibrosis: Secondary | ICD-10-CM | POA: Diagnosis not present

## 2018-01-16 DIAGNOSIS — Z6831 Body mass index (BMI) 31.0-31.9, adult: Secondary | ICD-10-CM | POA: Diagnosis not present

## 2018-01-16 DIAGNOSIS — R5383 Other fatigue: Secondary | ICD-10-CM | POA: Diagnosis not present

## 2018-01-16 DIAGNOSIS — M5030 Other cervical disc degeneration, unspecified cervical region: Secondary | ICD-10-CM | POA: Diagnosis not present

## 2018-01-16 DIAGNOSIS — M0589 Other rheumatoid arthritis with rheumatoid factor of multiple sites: Secondary | ICD-10-CM | POA: Diagnosis not present

## 2018-01-16 DIAGNOSIS — E669 Obesity, unspecified: Secondary | ICD-10-CM | POA: Diagnosis not present

## 2018-01-16 DIAGNOSIS — J8410 Pulmonary fibrosis, unspecified: Secondary | ICD-10-CM | POA: Diagnosis not present

## 2018-01-16 DIAGNOSIS — M2550 Pain in unspecified joint: Secondary | ICD-10-CM | POA: Diagnosis not present

## 2018-01-16 DIAGNOSIS — I288 Other diseases of pulmonary vessels: Secondary | ICD-10-CM | POA: Diagnosis not present

## 2018-01-22 ENCOUNTER — Telehealth (HOSPITAL_COMMUNITY): Payer: Self-pay | Admitting: *Deleted

## 2018-01-22 DIAGNOSIS — J8410 Pulmonary fibrosis, unspecified: Secondary | ICD-10-CM

## 2018-01-22 DIAGNOSIS — M0520 Rheumatoid vasculitis with rheumatoid arthritis of unspecified site: Secondary | ICD-10-CM

## 2018-01-22 NOTE — Telephone Encounter (Signed)
Received referral from [facility] Rheumatology, pt needs appt to see Dr Gala Romney along with echo and pfts for [facility] evaluation, pt has pulm fibrosis and rheumatoid arthritis.  Orders placed will sch appt.

## 2018-01-23 ENCOUNTER — Telehealth (HOSPITAL_COMMUNITY): Payer: Self-pay | Admitting: Vascular Surgery

## 2018-01-23 NOTE — Telephone Encounter (Signed)
Left pt message giving new pulm htn appt w/ db w/ echo and PFT 03/20/18. PFT @ 12, echo @ 1pm DB APPT @ 2:20

## 2018-01-30 DIAGNOSIS — H353133 Nonexudative age-related macular degeneration, bilateral, advanced atrophic without subfoveal involvement: Secondary | ICD-10-CM | POA: Diagnosis not present

## 2018-03-05 DIAGNOSIS — J84112 Idiopathic pulmonary fibrosis: Secondary | ICD-10-CM | POA: Diagnosis not present

## 2018-03-17 DIAGNOSIS — R0902 Hypoxemia: Secondary | ICD-10-CM | POA: Diagnosis not present

## 2018-03-17 DIAGNOSIS — J449 Chronic obstructive pulmonary disease, unspecified: Secondary | ICD-10-CM | POA: Diagnosis not present

## 2018-03-17 DIAGNOSIS — J84112 Idiopathic pulmonary fibrosis: Secondary | ICD-10-CM | POA: Diagnosis not present

## 2018-03-17 DIAGNOSIS — F17201 Nicotine dependence, unspecified, in remission: Secondary | ICD-10-CM | POA: Diagnosis not present

## 2018-03-19 DIAGNOSIS — M5030 Other cervical disc degeneration, unspecified cervical region: Secondary | ICD-10-CM | POA: Diagnosis not present

## 2018-03-19 DIAGNOSIS — J8410 Pulmonary fibrosis, unspecified: Secondary | ICD-10-CM | POA: Diagnosis not present

## 2018-03-19 DIAGNOSIS — J449 Chronic obstructive pulmonary disease, unspecified: Secondary | ICD-10-CM | POA: Diagnosis not present

## 2018-03-19 DIAGNOSIS — Z6831 Body mass index (BMI) 31.0-31.9, adult: Secondary | ICD-10-CM | POA: Diagnosis not present

## 2018-03-19 DIAGNOSIS — M0589 Other rheumatoid arthritis with rheumatoid factor of multiple sites: Secondary | ICD-10-CM | POA: Diagnosis not present

## 2018-03-19 DIAGNOSIS — E669 Obesity, unspecified: Secondary | ICD-10-CM | POA: Diagnosis not present

## 2018-03-19 DIAGNOSIS — M2550 Pain in unspecified joint: Secondary | ICD-10-CM | POA: Diagnosis not present

## 2018-03-20 ENCOUNTER — Ambulatory Visit (HOSPITAL_BASED_OUTPATIENT_CLINIC_OR_DEPARTMENT_OTHER)
Admission: RE | Admit: 2018-03-20 | Discharge: 2018-03-20 | Disposition: A | Discharge status: Home / Self Care | Payer: Medicare Other | Source: Ambulatory Visit | Attending: Internal Medicine | Admitting: Internal Medicine

## 2018-03-20 ENCOUNTER — Other Ambulatory Visit: Payer: Self-pay

## 2018-03-20 ENCOUNTER — Ambulatory Visit (HOSPITAL_COMMUNITY)
Admission: RE | Admit: 2018-03-20 | Discharge: 2018-03-20 | Disposition: A | Discharge status: Home / Self Care | Payer: Medicare Other | Source: Ambulatory Visit | Attending: Internal Medicine | Admitting: Internal Medicine

## 2018-03-20 ENCOUNTER — Encounter (HOSPITAL_COMMUNITY): Payer: Self-pay | Admitting: Internal Medicine

## 2018-03-20 VITALS — BP 136/70 | HR 100 | Wt 222.2 lb

## 2018-03-20 DIAGNOSIS — J8410 Pulmonary fibrosis, unspecified: Secondary | ICD-10-CM

## 2018-03-20 DIAGNOSIS — Z87891 Personal history of nicotine dependence: Secondary | ICD-10-CM | POA: Insufficient documentation

## 2018-03-20 DIAGNOSIS — E669 Obesity, unspecified: Secondary | ICD-10-CM | POA: Insufficient documentation

## 2018-03-20 DIAGNOSIS — I2721 Secondary pulmonary arterial hypertension: Secondary | ICD-10-CM

## 2018-03-20 DIAGNOSIS — I00 Rheumatic fever without heart involvement: Secondary | ICD-10-CM

## 2018-03-20 DIAGNOSIS — I272 Pulmonary hypertension, unspecified: Secondary | ICD-10-CM | POA: Insufficient documentation

## 2018-03-20 DIAGNOSIS — M0520 Rheumatoid vasculitis with rheumatoid arthritis of unspecified site: Secondary | ICD-10-CM

## 2018-03-20 DIAGNOSIS — M069 Rheumatoid arthritis, unspecified: Secondary | ICD-10-CM | POA: Insufficient documentation

## 2018-03-20 DIAGNOSIS — Z79899 Other long term (current) drug therapy: Secondary | ICD-10-CM | POA: Diagnosis not present

## 2018-03-20 DIAGNOSIS — J84112 Idiopathic pulmonary fibrosis: Secondary | ICD-10-CM | POA: Diagnosis not present

## 2018-03-20 DIAGNOSIS — G4733 Obstructive sleep apnea (adult) (pediatric): Secondary | ICD-10-CM | POA: Insufficient documentation

## 2018-03-20 DIAGNOSIS — J449 Chronic obstructive pulmonary disease, unspecified: Secondary | ICD-10-CM | POA: Diagnosis not present

## 2018-03-20 DIAGNOSIS — Z9981 Dependence on supplemental oxygen: Secondary | ICD-10-CM | POA: Insufficient documentation

## 2018-03-20 LAB — PULMONARY FUNCTION TEST
DL/VA % PRED: 52 %
DL/VA: 2.46 ml/min/mmHg/L
DLCO unc % pred: 19 %
DLCO unc: 6.59 ml/min/mmHg
FEF 25-75 Pre: 1.39 L/sec
FEF2575-%PRED-PRE: 58 %
FEV1-%PRED-PRE: 40 %
FEV1-PRE: 1.32 L
FEV1FVC-%Pred-Pre: 112 %
FEV6-%Pred-Pre: 38 %
FEV6-PRE: 1.61 L
FEV6FVC-%PRED-PRE: 106 %
FVC-%PRED-PRE: 36 %
FVC-PRE: 1.61 L
PRE FEV6/FVC RATIO: 100 %
Pre FEV1/FVC ratio: 82 %
RV % PRED: 86 %
RV: 2.25 L
TLC % pred: 51 %
TLC: 3.73 L

## 2018-03-20 NOTE — Progress Notes (Signed)
Advanced Heart Failure Clinic Note   Referring Physician: [facility] Rheumatology PCP: System, Pcp Not In PCP-Cardiologist: No primary care provider on file.  Rheuamatoid: [name], PA  Pulmonary: Dr Judith Part [facility] [place], [place])  HPI: [name] is a 72 y.o. male with a history of pulmonary fibrosis, rheumatoid arthritis, obesity, DDD, OSA not on CPAP (could not tolerate), prediabetes, and COPD on 3-4 L [place].  Referred from [facility] Rheumatology for evaluation for pulmonary HTN after enlarged PA noted on chest CT.  He presents today to establish care in HF clinic. He was a former truck Environmental health practitioner. Was a regular smoker and quit 15 years ago.  He was on O2 just as night for about 12 years. He was hospitalized in March 2019 and was diagnosed with idiopathic pulmonary fibrosis in [place], [place]. He was then discharged on continuous O2 and Ofev. Told he had asbestosis. Also has OSA but unable to tolerate any masks. His PCP started him on lasix shortly after that hospitalization for BLE edema, which resolved after 2 weeks. No longer taking lasix, but has PRN and rarely takes. He has SOB and hypoxia (<80%) with ADLs and walking short distances. No orthopnea, PND, or edema. Rare dizziness, about once/month. Last time it occurred, he had presyncope while sitting in the car. Resolved after sitting for a while. Has had a dry cough x2 weeks. No fever/chills. Appetite and every level poor. Uses cane in the house. Takes all medications. Does not weigh regularly.  Echo 03/20/18: EF 65-70% RV moderately dilated mild HL PAP  PFTs 03/20/18: FEV1: 1.32 FEV1/FVC: 82 DLCO: 19% (47% in 2008)  SH: Lives in [place], [place] with his wife. Former smoker (quit in 2004). No ETOH or drug use. No transportation issues. No problems with medications. 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   FH: Pt does not know family history.  Review of Systems: [y] = yes, [ ]  = no   General: Weight gain [ ] ;  Weight loss [ ] ; Anorexia [ ] ; Fatigue [ y]; Fever [ ] ; Chills [ ] ; Weakness [ ]   Cardiac: Chest pain/pressure [ ] ; Resting SOB ]; Exertional SOB [ y]; Orthopnea [ ] ; Pedal Edema [ y]; Palpitations [ ] ; Syncope [ ] ; Presyncope [ y]; Paroxysmal nocturnal dyspnea[ ]   Pulmonary: Cough [ y]; Wheezing[ ] ; Hemoptysis[ ] ; Sputum [ ] ; Snoring [ ]   GI: Vomiting[ ] ; Dysphagia[ ] ; Melena[ ] ; Hematochezia [ ] ; Heartburn[ ] ; Abdominal pain [ ] ; Constipation [ ] ; Diarrhea [ ] ; BRBPR [ ]   GU: Hematuria[ ] ; Dysuria [ ] ; Nocturia[ ]   Vascular: Pain in legs with walking [ ] ; Pain in feet with lying flat [ ] ; Non-healing sores [ ] ; Stroke [ ] ; TIA [ ] ; Slurred speech [ ] ;  Neuro: Headaches[ ] ; Vertigo[ ] ; Seizures[ ] ; Paresthesias[ ] ;Blurred vision [ ] ; Diplopia [ ] ; Vision changes [ ]   Ortho/Skin: Arthritis ]; Joint pain ]; Muscle pain [ ] ; Joint swelling [ ] ; Back Pain [ ] ; Rash [ ]   Psych: Depression[ ] ; Anxiety[ ]   Heme: Bleeding problems [ ] ; Clotting disorders [ ] ; Anemia [ ]   Endocrine: Diabetes [ y]; Thyroid dysfunction[ ]    Past Medical History:  Diagnosis Date  . Cataracts, bilateral   . COPD (chronic obstructive pulmonary disease) (HCC)    chronic O2 4 L  . Hearing loss   . Macular degeneration   . Rheumatoid arthritis (HCC)     Current Outpatient Medications  Medication Sig Dispense  Refill  . Artificial Tear Ointment (LUBRICANT EYE OP) Apply 1-2 drops to eye daily. Lubricant Eye gel    . folic acid (FOLVITE) 1 MG tablet Take 1 mg by mouth daily.  2  . furosemide (LASIX) 20 MG tablet Take 20 mg by mouth daily as needed.   1  . Multiple Vitamins-Minerals (PRESERVISION AREDS 2+MULTI VIT) CAPS Take 1 capsule by mouth 2 (two) times daily.    . naproxen sodium (ALEVE) 220 MG tablet Take 220-440 mg by mouth daily as needed (for pain).    . Omega-3 Fatty Acids (FISH OIL) 1000 MG CAPS Take 1 capsule by mouth 2 (two) times daily.    Marland Kitchen oxyCODONE-acetaminophen (PERCOCET) 5-325 MG tablet Take 1-2  tablets by mouth every 4 (four) hours as needed. 20 tablet 0  . predniSONE (DELTASONE) 5 MG tablet Take 5 mg by mouth 2 (two) times daily with a meal.     No current facility-administered medications for this encounter.     No Known Allergies    Social History   Socioeconomic History  . Marital status: Single    Spouse name: Not on file  . Number of children: Not on file  . Years of education: Not on file  . Highest education level: Not on file  Occupational History  . Not on file  Social Needs  . Financial resource strain: Not on file  . Food insecurity:    Worry: Not on file    Inability: Not on file  . Transportation needs:    Medical: Not on file    Non-medical: Not on file  Tobacco Use  . Smoking status: Former Games developer  . Smokeless tobacco: Never Used  Substance and Sexual Activity  . Alcohol use: No    Frequency: Never  . Drug use: No  . Sexual activity: Not on file  Lifestyle  . Physical activity:    Days per week: Not on file    Minutes per session: Not on file  . Stress: Not on file  Relationships  . Social connections:    Talks on phone: Not on file    Gets together: Not on file    Attends religious service: Not on file    Active member of club or organization: Not on file    Attends meetings of clubs or organizations: Not on file    Relationship status: Not on file  . Intimate partner violence:    Fear of current or ex partner: Not on file    Emotionally abused: Not on file    Physically abused: Not on file    Forced sexual activity: Not on file  Other Topics Concern  . Not on file  Social History Narrative  . Not on file     No family history on file.  Vitals:   03/20/18 1352  BP: 136/70  Pulse: 100  SpO2: 91%  Weight: 100.8 kg (222 lb 4 oz)   Wt Readings from Last 3 Encounters:  03/20/18 100.8 kg (222 lb 4 oz)  07/10/17 108.4 kg (239 lb)    PHYSICAL EXAM: General: No respiratory difficulty. Sitting in wheelchair. Elderly. Wearing  O2. HEENT: normal anicteric Neck: supple. JVP 6-7. Carotids 2+ bilat; no bruits. No lymphadenopathy or thyromegaly appreciated. Cor: PMI nondisplaced. Regular rate & rhythm. 2/6 TR Lungs: dry crackles no wheeze Abdomen: soft, nontender, nondistended. No hepatosplenomegaly. No bruits or masses. Good bowel sounds Extremities: no cyanosis, clubbing, rash, edema Neuro: alert & oriented x 3, cranial nerves grossly  intact. moves all 4 extremities w/o difficulty. Affect pleasant   ASSESSMENT & PLAN:  1. PAH. Large PA noted on chest CT in March (records unavailable). Group 3 PAH secondary to pulmonary fibrosis - Echo today with severe PAH and RV strain - Continue lasix 20 mg PRN - Main treatment will be treatment of pulmonary fibrosis and oxygenation - Refer to pulmonary rehab  2. COPD - Continue 3-4L O2. Increase O2 as needed to keep O2 sats >90% - Unable to tolerate CPAP  3. 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 Rheumatoid arthritis - Continue prednisone per rheumatology  Follow up PRN   Alford Highland, NP 03/20/18   Patient seen and examined with the above-signed Advanced Practice Provider and/or Housestaff. I personally reviewed laboratory data, imaging studies and relevant notes. I independently examined the patient and formulated the important aspects of the plan. I have edited the note to reflect any of my changes or salient points. I have personally discussed the plan with the patient and/or family.  Based on echo he has severe pulmonary HTN in setting of advanced pulmonary fibrosis, COPD and OSA - WHO GROUP 3. Currently on O2 but still with significant desaturations. Stressed need to keep sats > 90% at all times. Also need to consider restarting CPAP. Will refer to Pulmonary rehab. We discussed  the possibility of RHC but given WHO GROUP III disease no role for selective pulmonary artery vasodilators. Will see back as needed.   Arvilla Meres, MD  4:04 PM

## 2018-03-20 NOTE — Progress Notes (Signed)
  Echocardiogram 2D Echocardiogram has been performed.  [name] 03/20/2018, 1:51 PM

## 2018-03-20 NOTE — Patient Instructions (Signed)
Will refer you to Pulmonary Rehab in [place]. Their office will call you directly to schedule  Congratulations, you have graduated from our program!  Follow up as needed.  Take all medication as prescribed the day of your appointment. Bring all medications with you to your appointment.  Do the following things EVERYDAY: 1) Weigh yourself in the morning before breakfast. Write it down and keep it in a log. 2) Take your medicines as prescribed 3) Eat low salt foods-Limit salt (sodium) to 2000 mg per day.  4) Stay as active as you can everyday 5) Limit all fluids for the day to less than 2 liters

## 2018-04-29 DIAGNOSIS — H2513 Age-related nuclear cataract, bilateral: Secondary | ICD-10-CM | POA: Diagnosis not present

## 2018-04-29 DIAGNOSIS — H353134 Nonexudative age-related macular degeneration, bilateral, advanced atrophic with subfoveal involvement: Secondary | ICD-10-CM | POA: Diagnosis not present

## 2018-04-29 DIAGNOSIS — Z8669 Personal history of other diseases of the nervous system and sense organs: Secondary | ICD-10-CM | POA: Diagnosis not present

## 2018-05-07 DIAGNOSIS — J449 Chronic obstructive pulmonary disease, unspecified: Secondary | ICD-10-CM | POA: Diagnosis not present

## 2018-05-12 DIAGNOSIS — R05 Cough: Secondary | ICD-10-CM | POA: Diagnosis not present

## 2018-05-12 DIAGNOSIS — I10 Essential (primary) hypertension: Secondary | ICD-10-CM | POA: Diagnosis not present

## 2018-05-12 DIAGNOSIS — Z6831 Body mass index (BMI) 31.0-31.9, adult: Secondary | ICD-10-CM | POA: Diagnosis not present

## 2018-05-20 DIAGNOSIS — J8410 Pulmonary fibrosis, unspecified: Secondary | ICD-10-CM | POA: Diagnosis not present

## 2018-05-20 DIAGNOSIS — J449 Chronic obstructive pulmonary disease, unspecified: Secondary | ICD-10-CM | POA: Diagnosis not present

## 2018-05-20 DIAGNOSIS — M2550 Pain in unspecified joint: Secondary | ICD-10-CM | POA: Diagnosis not present

## 2018-05-20 DIAGNOSIS — M5030 Other cervical disc degeneration, unspecified cervical region: Secondary | ICD-10-CM | POA: Diagnosis not present

## 2018-05-20 DIAGNOSIS — M0589 Other rheumatoid arthritis with rheumatoid factor of multiple sites: Secondary | ICD-10-CM | POA: Diagnosis not present

## 2018-05-20 DIAGNOSIS — Z6831 Body mass index (BMI) 31.0-31.9, adult: Secondary | ICD-10-CM | POA: Diagnosis not present

## 2018-05-20 DIAGNOSIS — E669 Obesity, unspecified: Secondary | ICD-10-CM | POA: Diagnosis not present

## 2018-05-21 DIAGNOSIS — I10 Essential (primary) hypertension: Secondary | ICD-10-CM | POA: Diagnosis not present

## 2018-06-05 DIAGNOSIS — H33312 Horseshoe tear of retina without detachment, left eye: Secondary | ICD-10-CM | POA: Diagnosis not present

## 2018-06-19 DIAGNOSIS — J84112 Idiopathic pulmonary fibrosis: Secondary | ICD-10-CM | POA: Diagnosis not present

## 2018-06-19 DIAGNOSIS — J449 Chronic obstructive pulmonary disease, unspecified: Secondary | ICD-10-CM | POA: Diagnosis not present

## 2018-06-19 DIAGNOSIS — R0902 Hypoxemia: Secondary | ICD-10-CM | POA: Diagnosis not present

## 2018-06-19 DIAGNOSIS — E663 Overweight: Secondary | ICD-10-CM | POA: Diagnosis not present

## 2018-07-10 DIAGNOSIS — H353134 Nonexudative age-related macular degeneration, bilateral, advanced atrophic with subfoveal involvement: Secondary | ICD-10-CM | POA: Diagnosis not present

## 2018-07-10 DIAGNOSIS — H2513 Age-related nuclear cataract, bilateral: Secondary | ICD-10-CM | POA: Diagnosis not present

## 2018-07-11 ENCOUNTER — Institutional Professional Consult (permissible substitution): Payer: Medicare Other | Admitting: Internal Medicine

## 2018-07-22 ENCOUNTER — Telehealth: Payer: Self-pay | Admitting: Internal Medicine

## 2018-07-22 ENCOUNTER — Ambulatory Visit (INDEPENDENT_AMBULATORY_CARE_PROVIDER_SITE_OTHER): Payer: Medicare Other | Admitting: Internal Medicine

## 2018-07-22 ENCOUNTER — Encounter: Payer: Self-pay | Admitting: Internal Medicine

## 2018-07-22 VITALS — BP 146/64 | HR 122 | Ht 71.0 in | Wt 226.8 lb

## 2018-07-22 DIAGNOSIS — Z9221 Personal history of antineoplastic chemotherapy: Secondary | ICD-10-CM | POA: Diagnosis not present

## 2018-07-22 DIAGNOSIS — Z8739 Personal history of other diseases of the musculoskeletal system and connective tissue: Secondary | ICD-10-CM | POA: Diagnosis not present

## 2018-07-22 DIAGNOSIS — J849 Interstitial pulmonary disease, unspecified: Secondary | ICD-10-CM

## 2018-07-22 DIAGNOSIS — Z77090 Contact with and (suspected) exposure to asbestos: Secondary | ICD-10-CM | POA: Diagnosis not present

## 2018-07-22 DIAGNOSIS — J9611 Chronic respiratory failure with hypoxia: Secondary | ICD-10-CM | POA: Diagnosis not present

## 2018-07-22 DIAGNOSIS — I2729 Other secondary pulmonary hypertension: Secondary | ICD-10-CM

## 2018-07-22 DIAGNOSIS — Z87891 Personal history of nicotine dependence: Secondary | ICD-10-CM | POA: Diagnosis not present

## 2018-07-22 NOTE — Progress Notes (Signed)
Subjective:    [name], male    DOB: Aug 14, 1945, 73 y.o.   MRN: [id number]  HPI   [name] presents for interstitial lung disease evaluation.  He is being taken care of by a pulmonologist Dr Judith Part in [place], [place] but he relocated to [place] and patient is now without a pulmonologist.  Therefore he is been referred to the ILD center here  IOV 07/22/2018  [place] Integrated Comprehensive ILD Questionnaire  Symptoms: As best as I can gather in 2008 he had a right necrotizing pneumonia with a chest tube as evidenced by my review of the chart and my visualization of the CT scan.  He does not recollect this much.  He says his been diagnosed with interstitial lung disease starting spring 2019 [prior to that for many years he was just on nighttime oxygen for sleep apnea] and then starting May 2019 he has been on nintedanib.  Sometime around this time he also diagnosed with rheumatoid arthritis.  He says he took a few days of methotrexate but this made his breathing worse.  [name] the nurse practitioner at [facility] rheumatology is now considering Remicade but this is not been started yet.  He is currently on daily prednisone 5 mg/day but I do not know if this is for lung disease or for rheumatoid arthritis.  At this point in time he is on 3 L of oxygen continuously.  Today when we turned his oxygen off on room air at rest for 20 minutes his pulse ox was 85% on room air with a finger and forehead probe.  In terms of his own history he says he has had insidious onset of shortness of breath for the last 14 years.  It is been progressive for the last 1 year.  Present on exertion relieved by rest.  When he takes a shower or dress this is a level 2 dyspnea.  When he does household work or shopping it is level 3.  When walking up stairs or walking up a hill it is level 1 but this is with oxygen.  He only has cough occasionally.  And occasionally he is wheezy.   Past  Medical History : Diagnosis rheumatoid arthritis for few years although at one point in time he told me it only for the last 1 year.  Denies any other autoimmune disease.  Did not answer anything about diabetes or acid reflux.  Noticed to be on fish oil.  Denied heart disease.  He did see Dr. Gala Romney in cardiology in September 2019.  Echocardiogram shows severe pulmonary artery systolic pressure elevation to 80 mmHg.   ROS: Significant for joint pain for few years.   FAMILY HISTORY of LUNG DISEASE: Denies any family history of pulmonary fibrosis   EXPOSURE HISTORY: Smokes cigarettes since his teenage years.  He states he quit smoking in 2005 smoking 2 to 3 packs/day.  Although today he did smell of tobacco suspect ongoing smoking.  Denies any marijuana or electronic cigarettes or vaping.  No intravenous drug use.   HOME and HOBBY DETAILS : Lives in an apartment for 8 months.  No advance living environment.  No mold or mildew in the house.  No humidifier use.  No CPAP use.  [Is not able to tolerate CPAP/BiPAP] no nebulizer use.  No steam iron.  No Jacuzzi use.  No fountain inside the house.  No pet birds or parakeets.  No hamsters.  No feather pillow use.  No  feather blanket use.  No mold in the [facility] duct.  No guarding habits no music habits.   OCCUPATIONAL HISTORY (122 questions) : He did have asbestos exposure in the past working in [employer].  He did go tobacco as a young adult.  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  During this time was exposed to dusty environment   PULMONARY TOXICITY HISTORY (27 items): In 2019 for a few days took methotrexate according to his history but this made his dyspnea worse.  Has been on prednisone for 2 years.  Serial pulmonary function testing listed below.    Results for Jorje GuildJARRETT,  Archit A (MRN [id number]) as of 07/22/2018 14:56  Ref. Range 11/01/27 - [place] at time of ofev 03/20/2018 11:28 06/19/18 danviller per notes  FVC-Pre Latest Units: L 1.75L 1.61 1.77L/  FVC-%Pred-Pre Latest Units: %  36 47%  FEV1-Pre Latest Units: L  1.32   FEV1-%Pred-Pre Latest Units: %  40   Pre FEV1/FVC ratio Latest Units: %  82   Results for Jorje GuildJARRETT, Deo A (MRN [id number]) as of 07/22/2018 14:56  Ref. Range 03/20/2018 11:28  DLCO unc  Latest Units: ml/min/mmHg 6.59  DLCO unc % pred Latest Units: % 19    ECHO 03/20/18 Pulmonary arteries: Systolic pressure was severely increased. PA   peak pressure: 81 mm Hg (S). - Impressions: There is moderate to severe pulmonary HTN with RV   strain.  Impression  - There is moderate to severe pulmonary HTN with RV strain.  Review of Systems  Constitutional: Negative for fever and unexpected weight change.  HENT: Negative for congestion, dental problem, ear pain, nosebleeds, postnasal drip, rhinorrhea, sinus pressure, sneezing, sore throat and trouble swallowing.   Eyes: Negative for redness and itching.  Respiratory: Positive for cough, shortness of breath and wheezing. Negative for chest tightness.   Cardiovascular: Negative for palpitations and leg swelling.  Gastrointestinal: Negative for nausea and vomiting.  Genitourinary: Negative for dysuria.  Musculoskeletal: Positive for joint swelling.  Skin: Negative for rash.  Allergic/Immunologic: Negative.  Negative for environmental allergies, food allergies and immunocompromised state.  Neurological: Negative for headaches.  Hematological: Bruises/bleeds easily.  Psychiatric/Behavioral: Negative for dysphoric mood. The patient is not nervous/anxious.      has a past medical history of Cataracts, bilateral, COPD (chronic obstructive pulmonary disease) (HCC), Hearing loss, Macular degeneration, and Rheumatoid arthritis (HCC).   reports that he quit smoking about 15 years ago. His smoking use  included cigarettes. He has a 75.00 pack-year smoking history. He has never used smokeless tobacco.  Past Surgical History:  Procedure Laterality Date  . LUNG BIOPSY      No Known Allergies   There is no immunization history on file for this patient.  No family history on file.   Current Outpatient Medications:  .  Artificial Tear Ointment (LUBRICANT EYE OP), Apply 1-2 drops to eye daily. Lubricant Eye gel, Disp: , Rfl:  .  ciclesonide (ALVESCO) 160 MCG/ACT inhaler, Inhale 1 puff into the lungs 2 (two) times daily., Disp: , Rfl:  .  folic acid (FOLVITE) 1 MG tablet, Take 1 mg by mouth daily., Disp: , Rfl: 2 .  furosemide (LASIX) 20 MG tablet, Take 20 mg by mouth daily as needed. , Disp: , Rfl: 1 .  mirtazapine (REMERON) 30 MG tablet, ,  Disp: , Rfl:  .  mometasone-formoterol (DULERA) 100-5 MCG/ACT AERO, Inhale 2 puffs into the lungs 2 (two) times daily., Disp: , Rfl:  .  Multiple Vitamins-Minerals (PRESERVISION AREDS 2+MULTI VIT) CAPS, Take 1 capsule by mouth 2 (two) times daily., Disp: , Rfl:  .  naproxen sodium (ALEVE) 220 MG tablet, Take 220-440 mg by mouth daily as needed (for pain)., Disp: , Rfl:  .  Nintedanib (OFEV) 150 MG CAPS, Take 150 mg by mouth 2 (two) times daily., Disp: , Rfl:  .  Omega-3 Fatty Acids (FISH OIL) 1000 MG CAPS, Take 1 capsule by mouth 2 (two) times daily., Disp: , Rfl:  .  predniSONE (DELTASONE) 5 MG tablet, prednisone 5 mg tablet, Disp: , Rfl:  .  Turmeric 500 MG CAPS, turmeric root extract 500 mg capsule  Take 1 capsule every day by oral route., Disp: , Rfl:       Objective:   Physical Exam  Vitals:   07/22/18 1430  BP: (!) 146/64  Pulse: (!) 122  SpO2: 91%  Weight: 226 lb 12.8 oz (102.9 kg)  Height: 5\' 11"  (1.803 m)    Estimated body mass index is 31.63 kg/m as calculated from the following:   Height as of this encounter: 5\' 11"  (1.803 m).   Weight as of this encounter: 226 lb 12.8 oz (102.9 kg).  General Appearance:  Looks chronic ill,  OBESE + Head:  Normocephalic, without obvious abnormality, atraumatic Eyes:  PERRL - yes, conjunctiva/corneas - clear     Ears:  Normal external ear canals, both ears Nose:  G tube - no Throat:  ETT TUBE - no , OG tube - no. TOBACCO SMELL + Neck:  Supple,  No enlargement/tenderness/nodules Lungs: Distant . Basal crackles + Heart:  S1 and S2 normal, no murmur, CVP - no.  Pressors - no Abdomen:  Soft, no masses, no organomegaly Genitalia / Rectal:  Not done Extremities:  Extremities- intact. Has swelling of fingers +. CLUBBING + Skin:  ntact in exposed areas . Sacral area - x Neurologic:  Sedation - none -> RASS - +1 . Moves all 4s - yes. CAM-ICU - neg . Orientation - x3+         Assessment & Plan:   Patient Instructions  Chronic respiratory failure with hypoxia (HCC) ILD (interstitial lung disease) (HCC) History of rheumatoid arthritis History of asbestos exposure History methotrexate therapy  -He has many potential reasons for interstitial lung disease and that includes rheumatoid arthritis ILD, asbestosis, smoking-related ILD's.  Methotrexate toxicity but given his description and his disease I suspect rheumatoid arthritis/UIP pattern to be the most likely agent care.  Associated with emphysema.  -Even though this might not be IPF based on the new of Fall 2019 INBUILD study in [facility] it is very likely that the nintedanib is helpful for him.  Question would be if addition of CellCept or Imuran or Rituxan will be further helpful.  -In addition packing his pulmonary hypertension could be potentially beneficial if Dr. Gala Romney feels it might be worthwhile doing a right heart catheterization   PLAN-  - your pulmonary fibrosis can be from one of many reasons - do hypersensitivity pneumonitis profile blood work 07/22/2018  - sign release to get records from [facility] rheumatology  - do HRCT supine and prone ILD protocol at [facility] next few weeks - do blood gas test at [facility]-  next few weeks  - continue o2 even at rest  - continue ofev for now (normal  LFT  Jan 2020)    Other secondary pulmonary hypertension [facility](HCC)  - will message Dr [name] to see if we should right heart cathetrization   Followup - next few to several weeks but after CT and blood gas test         SIGNATURE    Dr. Kalman ShanMurali Chesney Klimaszewski, M.D., F.C.C.P,  Pulmonary and Critical Care Medicine Staff Physician, [facility] Center Director - Interstitial Lung Disease  Program  Pulmonary Fibrosis [facility] at [facility] [place], [place], [id number]  Pager: [phone], If no answer or between  15:00h - 7:00h: call [phone] Telephone: [phone]  3:25 PM 07/22/2018

## 2018-07-22 NOTE — H&P (View-Only) (Signed)
Subjective:    [name], male    DOB: Aug 14, 1945, 73 y.o.   MRN: [id number]  HPI   [name] presents for interstitial lung disease evaluation.  He is being taken care of by [name] Dr Judith Part in [place], [place] but he relocated to [place] and patient is now without [name].  Therefore he is been referred to the ILD center here  IOV 07/22/2018  [place] Integrated Comprehensive ILD Questionnaire  Symptoms: As best as I can gather in 2008 he had [name] with [name] as evidenced by my review of the chart and my visualization of the CT scan.  He does not recollect this much.  He says his been diagnosed with interstitial lung disease starting spring 2019 [prior to that for many years he was just on nighttime oxygen for sleep apnea] and then starting May 2019 he has been on nintedanib.  Sometime around this time he also diagnosed with rheumatoid arthritis.  He says he took [name] of methotrexate but this made his breathing worse.  [name] the nurse practitioner at [facility] rheumatology is now considering Remicade but this is not been started yet.  He is currently on daily prednisone 5 mg/day but I do not know if this is for lung disease or for rheumatoid arthritis.  At this point in time he is on 3 L of oxygen continuously.  Today when we turned his oxygen off on room air at rest for 20 minutes his pulse ox was 85% on room air with [name] finger and forehead probe.  In terms of his own history he says he has had insidious onset of shortness of breath for the last 14 years.  It is been progressive for the last 1 year.  Present on exertion relieved by rest.  When he takes [name] this is [name].  When he does household work or shopping it is level 3.  When walking up stairs or walking up [name] it is level 1 but this is with oxygen.  He only has cough occasionally.  And occasionally he is wheezy.   Past  Medical History : Diagnosis rheumatoid arthritis for few years although at one point in time he told me it only for the last 1 year.  Denies any other autoimmune disease.  Did not answer anything about diabetes or acid reflux.  Noticed to be on fish oil.  Denied heart disease.  He did see Dr. Gala Romney in cardiology in September 2019.  Echocardiogram shows severe pulmonary artery systolic pressure elevation to 80 mmHg.   ROS: Significant for joint pain for few years.   FAMILY HISTORY of LUNG DISEASE: Denies any family history of pulmonary fibrosis   EXPOSURE HISTORY: Smokes cigarettes since his teenage years.  He states he quit smoking in 2005 smoking 2 to 3 packs/day.  Although today he did smell of tobacco suspect ongoing smoking.  Denies any marijuana or electronic cigarettes or vaping.  No intravenous drug use.   HOME and HOBBY DETAILS : Lives in an apartment for 8 months.  No advance living environment.  No mold or mildew in the house.  No humidifier use.  No CPAP use.  [Is not able to tolerate CPAP/BiPAP] no nebulizer use.  No steam iron.  No Jacuzzi use.  No fountain inside the house.  No pet birds or parakeets.  No hamsters.  No feather pillow use.  No  feather blanket use.  No mold in the [facility] duct.  No guarding habits no music habits.   OCCUPATIONAL HISTORY (122 questions) : He did have asbestos exposure in the past working in [employer].  He did go tobacco as [name].  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  During this time was exposed to dusty environment   PULMONARY TOXICITY HISTORY (27 items): In 2019 for [name] took methotrexate according to his history but this made his dyspnea worse.  Has been on prednisone for 2 years.  Serial pulmonary function testing listed below.    Results for David GuildJARRETT,  [name] (MRN [id number]) as of 07/22/2018 14:56  Ref. Range 11/01/27 - [place] at time of ofev 03/20/2018 11:28 06/19/18 danviller per notes  FVC-Pre Latest Units: L 1.75L 1.61 1.77L/  FVC-%Pred-Pre Latest Units: %  36 47%  FEV1-Pre Latest Units: L  1.32   FEV1-%Pred-Pre Latest Units: %  40   Pre FEV1/FVC ratio Latest Units: %  82   Results for David GuildJARRETT, [name] (MRN [id number]) as of 07/22/2018 14:56  Ref. Range 03/20/2018 11:28  DLCO unc  Latest Units: ml/min/mmHg 6.59  DLCO unc % pred Latest Units: % 19    ECHO 03/20/18 Pulmonary arteries: Systolic pressure was severely increased. PA   peak pressure: 81 mm Hg (S). - Impressions: There is moderate to severe pulmonary HTN with RV   strain.  Impression  - There is moderate to severe pulmonary HTN with RV strain.  Review of Systems  Constitutional: Negative for fever and unexpected weight change.  HENT: Negative for congestion, dental problem, ear pain, nosebleeds, postnasal drip, rhinorrhea, sinus pressure, sneezing, sore throat and trouble swallowing.   Eyes: Negative for redness and itching.  Respiratory: Positive for cough, shortness of breath and wheezing. Negative for chest tightness.   Cardiovascular: Negative for palpitations and leg swelling.  Gastrointestinal: Negative for nausea and vomiting.  Genitourinary: Negative for dysuria.  Musculoskeletal: Positive for joint swelling.  Skin: Negative for rash.  Allergic/Immunologic: Negative.  Negative for environmental allergies, food allergies and immunocompromised state.  Neurological: Negative for headaches.  Hematological: Bruises/bleeds easily.  Psychiatric/Behavioral: Negative for dysphoric mood. The patient is not nervous/anxious.      has [name] past medical history of Cataracts, bilateral, COPD (chronic obstructive pulmonary disease) (HCC), Hearing loss, Macular degeneration, and Rheumatoid arthritis (HCC).   reports that he quit smoking about 15 years ago. His smoking use  included cigarettes. He has [name] 75.00 pack-year smoking history. He has never used smokeless tobacco.  Past Surgical History:  Procedure Laterality Date  . LUNG BIOPSY      No Known Allergies   There is no immunization history on file for this patient.  No family history on file.   Current Outpatient Medications:  .  Artificial Tear Ointment (LUBRICANT EYE OP), Apply 1-2 drops to eye daily. Lubricant Eye gel, Disp: , Rfl:  .  ciclesonide (ALVESCO) 160 MCG/ACT inhaler, Inhale 1 puff into the lungs 2 (two) times daily., Disp: , Rfl:  .  folic acid (FOLVITE) 1 MG tablet, Take 1 mg by mouth daily., Disp: , Rfl: 2 .  furosemide (LASIX) 20 MG tablet, Take 20 mg by mouth daily as needed. , Disp: , Rfl: 1 .  mirtazapine (REMERON) 30 MG tablet, ,  Disp: , Rfl:  .  mometasone-formoterol (DULERA) 100-5 MCG/ACT AERO, Inhale 2 puffs into the lungs 2 (two) times daily., Disp: , Rfl:  .  Multiple Vitamins-Minerals (PRESERVISION AREDS 2+MULTI VIT) CAPS, Take 1 capsule by mouth 2 (two) times daily., Disp: , Rfl:  .  naproxen sodium (ALEVE) 220 MG tablet, Take 220-440 mg by mouth daily as needed (for pain)., Disp: , Rfl:  .  Nintedanib (OFEV) 150 MG CAPS, Take 150 mg by mouth 2 (two) times daily., Disp: , Rfl:  .  Omega-3 Fatty Acids (FISH OIL) 1000 MG CAPS, Take 1 capsule by mouth 2 (two) times daily., Disp: , Rfl:  .  predniSONE (DELTASONE) 5 MG tablet, prednisone 5 mg tablet, Disp: , Rfl:  .  Turmeric 500 MG CAPS, turmeric root extract 500 mg capsule  Take 1 capsule every day by oral route., Disp: , Rfl:       Objective:   Physical Exam  Vitals:   07/22/18 1430  BP: (!) 146/64  Pulse: (!) 122  SpO2: 91%  Weight: 226 lb 12.8 oz (102.9 kg)  Height: 5\' 11"  (1.803 m)    Estimated body mass index is 31.63 kg/m as calculated from the following:   Height as of this encounter: 5\' 11"  (1.803 m).   Weight as of this encounter: 226 lb 12.8 oz (102.9 kg).  General Appearance:  Looks chronic ill,  OBESE + Head:  Normocephalic, without obvious abnormality, atraumatic Eyes:  PERRL - yes, conjunctiva/corneas - clear     Ears:  Normal external ear canals, both ears Nose:  G tube - no Throat:  ETT TUBE - no , OG tube - no. TOBACCO SMELL + Neck:  Supple,  No enlargement/tenderness/nodules Lungs: Distant . Basal crackles + Heart:  S1 and S2 normal, no murmur, CVP - no.  Pressors - no Abdomen:  Soft, no masses, no organomegaly Genitalia / Rectal:  Not done Extremities:  Extremities- intact. Has swelling of fingers +. CLUBBING + Skin:  ntact in exposed areas . Sacral area - x Neurologic:  Sedation - none -> RASS - +1 . Moves all 4s - yes. CAM-ICU - neg . Orientation - x3+         Assessment & Plan:   Patient Instructions  Chronic respiratory failure with hypoxia (HCC) ILD (interstitial lung disease) (HCC) History of rheumatoid arthritis History of asbestos exposure History methotrexate therapy  -He has many potential reasons for interstitial lung disease and that includes rheumatoid arthritis ILD, asbestosis, smoking-related ILD's.  Methotrexate toxicity but given his description and his disease I suspect rheumatoid arthritis/UIP pattern to be the most likely agent care.  Associated with emphysema.  -Even though this might not be IPF based on the new of Fall 2019 INBUILD study in [facility] it is very likely that the nintedanib is helpful for him.  Question would be if addition of CellCept or Imuran or Rituxan will be further helpful.  -In addition packing his pulmonary hypertension could be potentially beneficial if Dr. Gala Romney feels it might be worthwhile doing [name] right heart catheterization   PLAN-  - your pulmonary fibrosis can be from one of many reasons - do hypersensitivity pneumonitis profile blood work 07/22/2018  - sign release to get records from [facility] rheumatology  - do HRCT supine and prone ILD protocol at [facility] next few weeks - do blood gas test at [employer] [facility]-  next few weeks  - continue o2 even at rest  - continue ofev for now (normal  LFT  Jan 2020)    Other secondary pulmonary hypertension (HCC)  - will message Dr Dan to see if we should right heart cathetrization   Followup - next few to several weeks but after CT and blood gas test         SIGNATURE    Dr. Donnah Levert, M.D., F.C.C.P,  Pulmonary and Critical Care Medicine Staff Physician, [facility] Center Director - Interstitial Lung Disease  Program  Pulmonary Fibrosis Foundation - Care Center Network at [facility] [place], [place], [address]  Pager: [phone], If no answer or between  15:00h - 7:00h: call [phone] Telephone: [phone]  3:25 PM 07/22/2018   

## 2018-07-22 NOTE — Patient Instructions (Addendum)
Chronic respiratory failure with hypoxia (HCC) ILD (interstitial lung disease) (HCC) History of rheumatoid arthritis History of asbestos exposure History methotrexate therapy History of smoking  - your pulmonary fibrosis can be from one of many reasons - do hypersensitivity pneumonitis profile blood work 07/22/2018  - sign release to get records from [facility] rheumatology  - do HRCT supine and prone ILD protocol at [facility] next few weeks - do blood gas test at [employer]- next few weeks  - continue o2 even at rest  - continue ofev for now (normal LFT  Jan 2020)    Other secondary pulmonary hypertension (HCC)  - will message Dr Jesusita Oka to see if we should right heart cathetrization   Followup - next few to several weeks but after CT and blood gas test

## 2018-07-22 NOTE — Telephone Encounter (Signed)
Hi [name]  Echo PASP 81 mmg Hg and he has Rheumatoid arthritis. Wondering if he needs right heart cath?  Thanks    SIGNATURE    Dr. Kalman Shan, M.D., F.C.C.P,  Pulmonary and Critical Care Medicine Staff Physician, [facility] Center Director - Interstitial Lung Disease  Program  Pulmonary Fibrosis [facility] Network at [facility] [place], [place], [address]  Pager: [phone], If no answer or between  15:00h - 7:00h: call [phone] Telephone: [phone]  3:26 PM 07/22/2018

## 2018-07-24 ENCOUNTER — Encounter (HOSPITAL_COMMUNITY): Payer: Self-pay

## 2018-07-24 ENCOUNTER — Telehealth (HOSPITAL_COMMUNITY): Payer: Self-pay

## 2018-07-24 DIAGNOSIS — I2721 Secondary pulmonary arterial hypertension: Secondary | ICD-10-CM

## 2018-07-24 NOTE — Telephone Encounter (Signed)
We will set up for next week or two  tracey - can you schedule him for [facility]  thanks

## 2018-07-24 NOTE — Telephone Encounter (Signed)
Pt scheduled for RHC February 7th per Dr. Gala Romney. As patient with hearing difficulty, he put wife on phone. Instructions given to wife for preparation of procedure. She stated understanding of all. Letter mailed out with same instructions. Labs to be drawn morning or procedure.

## 2018-07-25 NOTE — Telephone Encounter (Signed)
[name], thanks a lot. Appreciate it  MR

## 2018-07-29 DIAGNOSIS — M5030 Other cervical disc degeneration, unspecified cervical region: Secondary | ICD-10-CM | POA: Diagnosis not present

## 2018-07-29 DIAGNOSIS — M2550 Pain in unspecified joint: Secondary | ICD-10-CM | POA: Diagnosis not present

## 2018-07-29 DIAGNOSIS — M0589 Other rheumatoid arthritis with rheumatoid factor of multiple sites: Secondary | ICD-10-CM | POA: Diagnosis not present

## 2018-07-29 DIAGNOSIS — J8410 Pulmonary fibrosis, unspecified: Secondary | ICD-10-CM | POA: Diagnosis not present

## 2018-07-29 DIAGNOSIS — Z6831 Body mass index (BMI) 31.0-31.9, adult: Secondary | ICD-10-CM | POA: Diagnosis not present

## 2018-07-29 DIAGNOSIS — J449 Chronic obstructive pulmonary disease, unspecified: Secondary | ICD-10-CM | POA: Diagnosis not present

## 2018-07-29 DIAGNOSIS — E669 Obesity, unspecified: Secondary | ICD-10-CM | POA: Diagnosis not present

## 2018-07-30 ENCOUNTER — Telehealth: Payer: Self-pay | Admitting: Internal Medicine

## 2018-07-30 NOTE — Telephone Encounter (Signed)
LVMTCB x 1 for [name], PA with [facility] contact # [phone].

## 2018-07-31 NOTE — Telephone Encounter (Signed)
Will route message to MR so that he may call Elpidio AnisErin Gray, PA to discuss this pt.

## 2018-08-01 ENCOUNTER — Ambulatory Visit (HOSPITAL_COMMUNITY)
Admission: RE | Admit: 2018-08-01 | Discharge: 2018-08-01 | Disposition: A | Discharge status: Home / Self Care | Payer: Medicare Other | Source: Ambulatory Visit | Attending: Internal Medicine | Admitting: Internal Medicine

## 2018-08-01 ENCOUNTER — Ambulatory Visit (INDEPENDENT_AMBULATORY_CARE_PROVIDER_SITE_OTHER)
Admission: RE | Admit: 2018-08-01 | Discharge: 2018-08-01 | Disposition: A | Discharge status: Home / Self Care | Payer: Medicare Other | Source: Ambulatory Visit | Attending: Internal Medicine | Admitting: Internal Medicine

## 2018-08-01 DIAGNOSIS — Z77090 Contact with and (suspected) exposure to asbestos: Secondary | ICD-10-CM | POA: Insufficient documentation

## 2018-08-01 DIAGNOSIS — Z8739 Personal history of other diseases of the musculoskeletal system and connective tissue: Secondary | ICD-10-CM | POA: Diagnosis not present

## 2018-08-01 DIAGNOSIS — J9611 Chronic respiratory failure with hypoxia: Secondary | ICD-10-CM | POA: Diagnosis not present

## 2018-08-01 DIAGNOSIS — J849 Interstitial pulmonary disease, unspecified: Secondary | ICD-10-CM

## 2018-08-01 DIAGNOSIS — J449 Chronic obstructive pulmonary disease, unspecified: Secondary | ICD-10-CM | POA: Diagnosis not present

## 2018-08-01 LAB — BLOOD GAS, ARTERIAL
ACID-BASE EXCESS: 4.4 mmol/L — AB (ref 0.0–2.0)
Bicarbonate: 28.4 mmol/L — ABNORMAL HIGH (ref 20.0–28.0)
Drawn by: 545251
O2 CONTENT: 3 L/min
O2 SAT: 87.1 %
Patient temperature: 98.6
pCO2 arterial: 42.6 mmHg (ref 32.0–48.0)
pH, Arterial: 7.439 (ref 7.350–7.450)
pO2, Arterial: 50.8 mmHg — ABNORMAL LOW (ref 83.0–108.0)

## 2018-08-01 NOTE — Progress Notes (Signed)
Out patient visit for ABG.  Patient states he wears 3 l/minute Nasal  Cannula continuously.  ABG obtained on 3.0 L/Min Nasal cannula.  DR. Marchelle Gearing called because PO2 was 50.8 and SaO2 -87.1 on 3/0l/min.  Dr Marchelle Gearing ordered O2 to be increased to 4.0 l/minute Nasal cannula.  O2 increased on patient's concentrator to 4.o l/min and patient understands to used 4.0l/min

## 2018-08-05 NOTE — Telephone Encounter (Signed)
MR - please advise if you spoke with [name]. Thanks.

## 2018-08-07 NOTE — Telephone Encounter (Signed)
[name] PA with [facility] contact # [phone] or cell phone [phone] would like a call back from MR as soon as possible today regarding patient.  MR please advise.

## 2018-08-07 NOTE — Telephone Encounter (Signed)
D/w [name]  -She says she reviewed my note.  She says pain from seropositive rheumatoid arthritis is significant.  She feels CellCept or Imuran will not control the pain as much.  Therefore she feels that Remicade would be a better option for the patient's rheumatoid arthritis.  She also says that Rituxan is probably not a good option.  We resolved that she should treat the pain with the best suitable agent.  I will continue to treat interstitial lung disease currently with nintedanib given its beneficial effects even non--IPF but progressive ILD  Recent blood gas did not show any hypercapnia      SIGNATURE    Dr. Kalman Shan, M.D., F.C.C.P,  Pulmonary and Critical Care Medicine Staff Physician, [facility] Director - Interstitial Lung Disease  Program  Pulmonary Fibrosis [facility] at [facility] [place], [place], [address]  Pager: [phone], If no answer or between  15:00h - 7:00h: call [phone] Telephone: [phone]  5:16 PM 08/07/2018     Results for ABDISAMAD, MARINE (MRN [id number]) as of 08/07/2018 17:03  Ref. Range 08/01/2018 12:44  Sample type Unknown ARTERIAL DRAW  O2 Content Latest Units: L/min 3.0  pH, Arterial Latest Ref Range: 7.350 - 7.450  7.439  pCO2 arterial Latest Ref Range: 32.0 - 48.0 mmHg 42.6  pO2, Arterial Latest Ref Range: 83.0 - 108.0 mmHg 50.8 (L)  Acid-Base Excess Latest Ref Range: 0.0 - 2.0 mmol/L 4.4 (H)  Bicarbonate Latest Ref Range: 20.0 - 28.0 mmol/L 28.4 (H)  O2 Saturation Latest Units: % 87.1  Patient temperature Unknown 98.6  Collection site Unknown RIGHT RADIAL  Allens test (pass/fail) Latest Ref Range: PASS  PASS

## 2018-08-07 NOTE — Telephone Encounter (Signed)
[name] is calling back [phone]

## 2018-08-08 ENCOUNTER — Other Ambulatory Visit: Payer: Self-pay

## 2018-08-08 ENCOUNTER — Ambulatory Visit (HOSPITAL_COMMUNITY)
Admission: RE | Admit: 2018-08-08 | Discharge: 2018-08-08 | Disposition: A | Discharge status: Home / Self Care | Payer: Medicare Other | Source: Ambulatory Visit | Attending: Internal Medicine | Admitting: Internal Medicine

## 2018-08-08 ENCOUNTER — Encounter (HOSPITAL_COMMUNITY)
Admission: RE | Disposition: A | Discharge status: Home / Self Care | Payer: Self-pay | Source: Ambulatory Visit | Attending: Internal Medicine

## 2018-08-08 DIAGNOSIS — J9611 Chronic respiratory failure with hypoxia: Secondary | ICD-10-CM | POA: Insufficient documentation

## 2018-08-08 DIAGNOSIS — I2721 Secondary pulmonary arterial hypertension: Secondary | ICD-10-CM | POA: Diagnosis not present

## 2018-08-08 DIAGNOSIS — Z79899 Other long term (current) drug therapy: Secondary | ICD-10-CM | POA: Diagnosis not present

## 2018-08-08 DIAGNOSIS — J449 Chronic obstructive pulmonary disease, unspecified: Secondary | ICD-10-CM | POA: Diagnosis not present

## 2018-08-08 DIAGNOSIS — J849 Interstitial pulmonary disease, unspecified: Secondary | ICD-10-CM | POA: Diagnosis not present

## 2018-08-08 DIAGNOSIS — Z87891 Personal history of nicotine dependence: Secondary | ICD-10-CM | POA: Diagnosis not present

## 2018-08-08 DIAGNOSIS — I2729 Other secondary pulmonary hypertension: Secondary | ICD-10-CM | POA: Insufficient documentation

## 2018-08-08 DIAGNOSIS — M069 Rheumatoid arthritis, unspecified: Secondary | ICD-10-CM | POA: Insufficient documentation

## 2018-08-08 HISTORY — PX: RIGHT HEART CATH: CATH118263

## 2018-08-08 LAB — POCT I-STAT EG7
ACID-BASE EXCESS: 4 mmol/L — AB (ref 0.0–2.0)
Acid-Base Excess: 2 mmol/L (ref 0.0–2.0)
Bicarbonate: 28.7 mmol/L — ABNORMAL HIGH (ref 20.0–28.0)
Bicarbonate: 31 mmol/L — ABNORMAL HIGH (ref 20.0–28.0)
CALCIUM ION: 1.17 mmol/L (ref 1.15–1.40)
Calcium, Ion: 1.09 mmol/L — ABNORMAL LOW (ref 1.15–1.40)
HCT: 38 % — ABNORMAL LOW (ref 39.0–52.0)
HCT: 40 % (ref 39.0–52.0)
Hemoglobin: 12.9 g/dL — ABNORMAL LOW (ref 13.0–17.0)
Hemoglobin: 13.6 g/dL (ref 13.0–17.0)
O2 Saturation: 67 %
O2 Saturation: 67 %
PO2 VEN: 36 mmHg (ref 32.0–45.0)
Potassium: 3.5 mmol/L (ref 3.5–5.1)
Potassium: 3.8 mmol/L (ref 3.5–5.1)
Sodium: 143 mmol/L (ref 135–145)
Sodium: 145 mmol/L (ref 135–145)
TCO2: 30 mmol/L (ref 22–32)
TCO2: 33 mmol/L — ABNORMAL HIGH (ref 22–32)
pCO2, Ven: 50.4 mmHg (ref 44.0–60.0)
pCO2, Ven: 53.6 mmHg (ref 44.0–60.0)
pH, Ven: 7.363 (ref 7.250–7.430)
pH, Ven: 7.37 (ref 7.250–7.430)
pO2, Ven: 37 mmHg (ref 32.0–45.0)

## 2018-08-08 LAB — BASIC METABOLIC PANEL
Anion gap: 8 (ref 5–15)
BUN: 10 mg/dL (ref 8–23)
CO2: 30 mmol/L (ref 22–32)
Calcium: 8.9 mg/dL (ref 8.9–10.3)
Chloride: 104 mmol/L (ref 98–111)
Creatinine, Ser: 0.86 mg/dL (ref 0.61–1.24)
GFR calc Af Amer: >60 mL/min (ref >60)
GFR calc non Af Amer: >60 mL/min (ref >60)
Glucose, Bld: 94 mg/dL (ref 70–99)
Potassium: 4.9 mmol/L (ref 3.5–5.1)
Sodium: 142 mmol/L (ref 135–145)

## 2018-08-08 LAB — CBC
HEMATOCRIT: 44.9 % (ref 39.0–52.0)
HEMOGLOBIN: 14.6 g/dL (ref 13.0–17.0)
MCH: 30.4 pg (ref 26.0–34.0)
MCHC: 32.5 g/dL (ref 30.0–36.0)
MCV: 93.3 fL (ref 80.0–100.0)
Platelets: 224 10*3/uL (ref 150–400)
RBC: 4.81 MIL/uL (ref 4.22–5.81)
RDW: 12.8 % (ref 11.5–15.5)
WBC: 9 10*3/uL (ref 4.0–10.5)
nRBC: 0 % (ref 0.0–0.2)

## 2018-08-08 SURGERY — RIGHT HEART CATH
Anesthesia: LOCAL

## 2018-08-08 MED ORDER — SODIUM CHLORIDE 0.9% FLUSH: 3.0000 mL | Freq: Two times a day (BID) | INTRAVENOUS | Status: DC

## 2018-08-08 MED ORDER — SODIUM CHLORIDE 0.9% FLUSH: 3.0000 mL | INTRAVENOUS | Status: DC | PRN

## 2018-08-08 MED ORDER — SODIUM CHLORIDE 0.9 % IV SOLN: 250.0000 mL | INTRAVENOUS | Status: DC | PRN

## 2018-08-08 MED ORDER — ONDANSETRON HCL 4 MG/2ML IJ SOLN: 4.0000 mg | Freq: Four times a day (QID) | INTRAMUSCULAR | Status: DC | PRN

## 2018-08-08 MED ORDER — SODIUM CHLORIDE 0.9 % IV SOLN
INTRAVENOUS | Status: DC
  Administered 2018-08-08: 11:00:00 via INTRAVENOUS

## 2018-08-08 MED ORDER — LIDOCAINE HCL (PF) 1 % IJ SOLN
INTRAMUSCULAR | Status: DC | PRN
  Administered 2018-08-08: 2 mL

## 2018-08-08 MED ORDER — ASPIRIN 81 MG PO CHEW
81.0000 mg | CHEWABLE_TABLET | ORAL | Status: AC
  Administered 2018-08-08: 81 mg via ORAL
  Filled 2018-08-08: qty 1

## 2018-08-08 MED ORDER — LIDOCAINE HCL (PF) 1 % IJ SOLN
INTRAMUSCULAR | Status: AC
  Filled 2018-08-08: qty 30

## 2018-08-08 MED ORDER — HEPARIN (PORCINE) IN NACL 1000-0.9 UT/500ML-% IV SOLN
INTRAVENOUS | Status: AC
  Filled 2018-08-08: qty 500

## 2018-08-08 MED ORDER — ACETAMINOPHEN 325 MG PO TABS: 650.0000 mg | ORAL_TABLET | ORAL | Status: DC | PRN

## 2018-08-08 SURGICAL SUPPLY — 9 items
CATH BALLN WEDGE 5F 110CM (CATHETERS) ×1 IMPLANT
GUIDEWIRE .025 260CM (WIRE) ×1 IMPLANT
KIT HEART LEFT (KITS) ×2 IMPLANT
PACK CARDIAC CATHETERIZATION (CUSTOM PROCEDURE TRAY) ×2 IMPLANT
PROTECTION STATION PRESSURIZED (MISCELLANEOUS) ×2
SHEATH GLIDE SLENDER 4/5FR (SHEATH) ×1 IMPLANT
STATION PROTECTION PRESSURIZED (MISCELLANEOUS) IMPLANT
TRANSDUCER W/STOPCOCK (MISCELLANEOUS) ×2 IMPLANT
TUBING CIL FLEX 10 FLL-RA (TUBING) ×2 IMPLANT

## 2018-08-08 NOTE — Progress Notes (Signed)
Ambulated in room and  voided

## 2018-08-08 NOTE — Interval H&P Note (Signed)
History and Physical Interval Note:  08/08/2018 1:16 PM  [name]  has presented today for surgery, with the diagnosis of Pulm hypertention  The various methods of treatment have been discussed with the patient and family. After consideration of risks, benefits and other options for treatment, the patient has consented to  Procedure(s): RIGHT HEART CATH (N/A) as a surgical intervention .  The patient's history has been reviewed, patient examined, no change in status, stable for surgery.  I have reviewed the patient's chart and labs.  Questions were answered to the patient's satisfaction.     [name]

## 2018-08-08 NOTE — Discharge Instructions (Signed)
Brachial Site Care ° °This sheet gives you information about how to care for yourself after your procedure. Your health care provider may also give you more specific instructions. If you have problems or questions, contact your health care provider. °What can I expect after the procedure? °After the procedure, it is common to have: °· Bruising and tenderness at the catheter insertion area. °Follow these instructions at home: °Medicines °· Take over-the-counter and prescription medicines only as told by your health care provider. °Insertion site care °· Follow instructions from your health care provider about how to take care of your insertion site. Make sure you: °? Wash your hands with soap and water before you change your bandage (dressing). If soap and water are not available, use hand sanitizer. °? Change your dressing as told by your health care provider. °? Leave stitches (sutures), skin glue, or adhesive strips in place. These skin closures may need to stay in place for 2 weeks or longer. If adhesive strip edges start to loosen and curl up, you may trim the loose edges. Do not remove adhesive strips completely unless your health care provider tells you to do that. °· Check your insertion site every day for signs of infection. Check for: °? Redness, swelling, or pain. °? Fluid or blood. °? Pus or a bad smell. °? Warmth. °· Do not take baths, swim, or use a hot tub until your health care provider approves. °· You may shower 24-48 hours after the procedure, or as directed by your health care provider. °? Remove the dressing and gently wash the site with plain soap and water. °? Pat the area dry with a clean towel. °? Do not rub the site. That could cause bleeding. °· Do not apply powder or lotion to the site. °Activity ° °· For 24 hours after the procedure, or as directed by your health care provider: °? Do not flex or bend the affected arm. °? Do not push or pull heavy objects with the affected arm. °? Do not  drive yourself home from the hospital or clinic. You may drive 24 hours after the procedure unless your health care provider tells you not to. °? Do not operate machinery or power tools. °· Do not lift anything that is heavier than 10 lb (4.5 kg), or the limit that you are told, until your health care provider says that it is safe. °· Ask your health care provider when it is okay to: °? Return to work or school. °? Resume usual physical activities or sports. °? Resume sexual activity. °General instructions °· If the catheter site starts to bleed, raise your arm and put firm pressure on the site. If the bleeding does not stop, get help right away. This is a medical emergency. °· If you went home on the same day as your procedure, a responsible adult should be with you for the first 24 hours after you arrive home. °· Keep all follow-up visits as told by your health care provider. This is important. °Contact a health care provider if: °· You have a fever. °· You have redness, swelling, or yellow drainage around your insertion site. °Get help right away if: °· You have unusual pain at the radial site. °· The catheter insertion area swells very fast. °· The insertion area is bleeding, and the bleeding does not stop when you hold steady pressure on the area. °· Your arm or hand becomes pale, cool, tingly, or numb. °These symptoms may represent a serious problem   that is an emergency. Do not wait to see if the symptoms will go away. Get medical help right away. Call your local emergency services (911 in the [place] Do not drive yourself to the hospital. °Summary °· After the procedure, it is common to have bruising and tenderness at the site. °· Follow instructions from your health care provider about how to take care of your radial site wound. Check the wound every day for signs of infection. °· Do not lift anything that is heavier than 10 lb (4.5 kg), or the limit that you are told, until your health care provider says  that it is safe. °This information is not intended to replace advice given to you by your health care provider. Make sure you discuss any questions you have with your health care provider. °Document Released: 07/21/2010 Document Revised: 07/24/2017 Document Reviewed: 07/24/2017 °Elsevier Interactive Patient Education © 2019 [employer] ° °

## 2018-08-11 ENCOUNTER — Telehealth: Payer: Self-pay | Admitting: Internal Medicine

## 2018-08-11 ENCOUNTER — Encounter (HOSPITAL_COMMUNITY): Payer: Self-pay | Admitting: Internal Medicine

## 2018-08-11 MED FILL — Heparin Sod (Porcine)-NaCl IV Soln 1000 Unit/500ML-0.9%: INTRAVENOUS | Qty: 500 | Status: AC

## 2018-08-11 NOTE — Telephone Encounter (Signed)
ATC patient, EP tried calling earlier with CT results. Left message for patient to call back

## 2018-08-11 NOTE — Telephone Encounter (Signed)
Notes recorded by Kalman Shan, MD on 08/08/2018 at 6:34 PM EST Let patient know CT shows fibrosis and possible lung nodule. Let him know to keep followup. ------------------------------------------ Spoke with pt's wife. She is aware of results. Nothing further was needed.

## 2018-08-12 ENCOUNTER — Telehealth: Payer: Self-pay | Admitting: Internal Medicine

## 2018-08-12 NOTE — Telephone Encounter (Signed)
David Shan, MD  Dorothyann Mourer, Farley Ly, CMA        Let patient know CT shows fibrosis and possible lung nodule. Let him know to keep followup.   Called and spoke with pt's wife [name] letting her know the results of pt's HRCT. [name] expressed understanding. Nothing further needed.

## 2018-08-21 ENCOUNTER — Encounter: Payer: Self-pay | Admitting: Internal Medicine

## 2018-08-21 ENCOUNTER — Ambulatory Visit (INDEPENDENT_AMBULATORY_CARE_PROVIDER_SITE_OTHER): Payer: Medicare Other | Admitting: Internal Medicine

## 2018-08-21 VITALS — BP 120/70 | HR 100 | Ht 71.0 in | Wt 225.8 lb

## 2018-08-21 DIAGNOSIS — Z77090 Contact with and (suspected) exposure to asbestos: Secondary | ICD-10-CM

## 2018-08-21 DIAGNOSIS — S00419A Abrasion of unspecified ear, initial encounter: Secondary | ICD-10-CM | POA: Diagnosis not present

## 2018-08-21 DIAGNOSIS — J849 Interstitial pulmonary disease, unspecified: Secondary | ICD-10-CM

## 2018-08-21 DIAGNOSIS — Z23 Encounter for immunization: Secondary | ICD-10-CM | POA: Diagnosis not present

## 2018-08-21 DIAGNOSIS — Z87891 Personal history of nicotine dependence: Secondary | ICD-10-CM | POA: Diagnosis not present

## 2018-08-21 DIAGNOSIS — Z9221 Personal history of antineoplastic chemotherapy: Secondary | ICD-10-CM

## 2018-08-21 DIAGNOSIS — Z5181 Encounter for therapeutic drug level monitoring: Secondary | ICD-10-CM

## 2018-08-21 DIAGNOSIS — J8489 Other specified interstitial pulmonary diseases: Secondary | ICD-10-CM

## 2018-08-21 DIAGNOSIS — M358 Other specified systemic involvement of connective tissue: Secondary | ICD-10-CM | POA: Diagnosis not present

## 2018-08-21 DIAGNOSIS — R911 Solitary pulmonary nodule: Secondary | ICD-10-CM | POA: Diagnosis not present

## 2018-08-21 NOTE — Progress Notes (Signed)
HPI   [name] presents for interstitial lung disease evaluation.  He is being taken care of by [name] Dr Judith Part in [place], [place] but he relocated to [place] and patient is now without [name].  Therefore he is been referred to the ILD center here  IOV 07/22/2018  [name]  Symptoms: As best as I can gather in 2008 he had [name] with [name] as evidenced by my review of the chart and my visualization of the CT scan.  He does not recollect this much.  He says his been diagnosed with interstitial lung disease starting spring 2019 [prior to that for many years he was just on nighttime oxygen for sleep apnea] and then starting May 2019 he has been on nintedanib.  Sometime around this time he also diagnosed with rheumatoid arthritis.  He says he took [name] of methotrexate but this made his breathing worse.  [name] the nurse practitioner at [facility] rheumatology is now considering Remicade but this is not been started yet.  He is currently on daily prednisone 5 mg/day but I do not know if this is for lung disease or for rheumatoid arthritis.  At this point in time he is on 3 L of oxygen continuously.  Today when we turned his oxygen off on room air at rest for 20 minutes his pulse ox was 85% on room air with [name] finger and forehead probe.  In terms of his own history he says he has had insidious onset of shortness of breath for the last 14 years.  It is been progressive for the last 1 year.  Present on exertion relieved by rest.  When he takes [name] this is [name].  When he does household work or shopping it is level 3.  When walking up stairs or walking up [name] it is level 1 but this is with oxygen.  He only has cough occasionally.  And occasionally he is wheezy.   Past Medical History : Diagnosis rheumatoid arthritis for few years although at one point in time he told  me it only for the last 1 year.  Denies any other autoimmune disease.  Did not answer anything about diabetes or acid reflux.  Noticed to be on fish oil.  Denied heart disease.  He did see Dr. Gala Romney in cardiology in September 2019.  Echocardiogram shows severe pulmonary artery systolic pressure elevation to 80 mmHg.   ROS: Significant for joint pain for few years.   FAMILY HISTORY of LUNG DISEASE: Denies any family history of pulmonary fibrosis   EXPOSURE HISTORY: Smokes cigarettes since his teenage years.  He states he quit smoking in 2005 smoking 2 to 3 packs/day.  Although today he did smell of tobacco suspect ongoing smoking.  Denies any marijuana or electronic cigarettes or vaping.  No intravenous drug use.   HOME and HOBBY DETAILS : Lives in an apartment for 8 months.  No advance living environment.  No mold or mildew in the house.  No humidifier use.  No CPAP use.  [Is not able to tolerate CPAP/BiPAP] no nebulizer use.  No steam iron.  No Jacuzzi use.  No fountain inside the house.  No pet birds or parakeets.  No hamsters.  No feather pillow use.  No feather blanket use.  No mold in the [facility] duct.  No guarding habits no music habits.   OCCUPATIONAL  HISTORY (122 questions) : He did have asbestos exposure in the past working in [employer].  He did go tobacco as [name].  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  During this time was exposed to dusty environment   PULMONARY TOXICITY HISTORY (27 items): In 2019 for [name] took methotrexate according to his history but this made his dyspnea worse.  Has been on prednisone for 2 years.  Serial pulmonary function testing listed below.    [name], [name] (MRN [id number]) as of 07/22/2018 14:56  Ref. Range 11/01/27 - [place] at time of ofev 03/20/2018  11:28 06/19/18 [place] per notes  FVC-Pre Latest Units: L 1.75L 1.61 1.77L/  FVC-%Pred-Pre Latest Units: %  36 47%  FEV1-Pre Latest Units: L  1.32   FEV1-%Pred-Pre Latest Units: %  40   Pre FEV1/FVC ratio Latest Units: %  82   [name], [name] (MRN [id number]) as of 07/22/2018 14:56  Ref. Range 03/20/2018 11:28  DLCO unc  Latest Units: ml/min/mmHg 6.59  DLCO unc % pred Latest Units: % 19    ECHO 03/20/18 Pulmonary arteries: Systolic pressure was severely increased. PA   peak pressure: 81 mm Hg (S). - Impressions: There is moderate to severe pulmonary HTN with RV   strain.  Impression  - There is moderate to severe pulmonary HTN with RV strain.      OV 08/21/2018  Subjective:  [name], male , DOB: 02/06/1946 , age 73 y.o. , MRN: [id number] , ADDRESS: [address] [place] [id number]   08/21/2018 -   Chief Complaint  Patient presents with  . Follow-up    [name] and is still having problems with breathing when he exerts himself. Pt also has an occ dry cough and has had some mild CP.  DME: [employer], 4 pulse with exertion and 4 continuous at home.     HPI [name] 73 y.o. -presents to ILD clinic for follow-up.  He presents with his wife  The main question is the etiology of his interstitial lung disease.  He has [name] history of rheumatoid arthritis, occupational exposures and age and male gender can put him at risk for UIP/IPF.  In addition in the past he has had methotrexate previous smoker.  Therefore we will subjected him to [name] work-up.  His previous pulmonologist had already started him on nintedanib.  He had [name] high-resolution CT chest that is classic UIP.   The next main question was 1 of treatment.  He is currently on nintedanib.  In the interim his rheumatology PA Elpidio Anisrin Gray called me.  She reported the patient has significant pain from rheumatoid arthritis and felt that CellCept or  Imuran would not control the pain.  She is referred Remicade and she would adjust the prednisone.  I supported the plan.  Patient states he only has mild to moderate stiffness at this point of his joints.   The next issue is significant symptomatology of dyspnea.  The dyspnea scale is listed below.  This is quite disabling.  Therefore we did [name] blood gas to look for hypercapnia in case he needed nighttime BiPAP but there is no carbon dioxide retention on the blood  gas below.  In addition I corresponded with Dr. Gala RomneyBensimhon and he did [name] right heart catheterization and there is only mild pulmonary venous hypertension.  Patient has never been to rehabilitation and is willing to go  He has [name] new issue of left upper lobe nodular nodule 2.7 cm.  Radiologist recommending CT chest in 3 months.  New complaint is that the oxygen tubing is causing abrasion of both ear on the pinna bilaterally.  He applies Neosporin sporadically.        SYMPTOM SCALE - ILD 08/21/2018   O2 use 4LNC at rest  Shortness of Breath 0 -> 5 scale with 5 being worst (score 6 If unable to do)  At rest 1  Simple tasks - showers, clothes change, eating, shaving 4  Household (dishes, doing bed, laundry) 3  Shopping 3  Walking level at own pace 3  Walking keeping up with others of same age 36  Walking up Stairs 3  Walking up Hill 3  Total (40 - 48) Dyspnea Score 24  How bad is your cough? x  How bad is your fatigue x        IMPRESSION: 1. Spectrum of findings compatible with severe fibrotic interstitial lung disease with advanced honeycombing. Although the distribution is slightly atypical, there are no disqualifying features such as air trapping or nodularity, and the findings are considered most compatible with usual interstitial pneumonia (UIP). Findings are consistent with UIP per consensus guidelines: Diagnosis of Idiopathic Pulmonary Fibrosis: An Official ATS/ERS/JRS/ALAT Clinical Practice Guideline. Am Rosezetta SchlatterJ Respir  Crit Care Med Vol 198, Iss 5, [phone], Mar 02 2017. 2. Mild mediastinal and bilateral hilar adenopathy, nonspecific, most likely reactive. 3. Poorly marginated nodular 2.7 cm focus of consolidation in the left upper lobe is nonspecific, favor focal fibrotic change, cannot exclude neoplasm. Suggest attention on follow-up chest CT in 3 months. 4. Two-vessel coronary atherosclerosis.  Aortic Atherosclerosis (ICD10-I70.0) and Emphysema (ICD10-J43.9).   Electronically Signed   By: Delbert PhenixJason [name] Poff M.D.   On: 08/04/2018 10:28   [name], [name] (MRN [id number]) as of 08/21/2018 10:44  Ref. Range 08/01/2018 12:44  pH, Arterial Latest Ref Range: 7.350 - 7.450  7.439  pCO2 arterial Latest Ref Range: 32.0 - 48.0 mmHg 42.6  pO2, Arterial Latest Ref Range: 83.0 - 108.0 mmHg 50.8 (L)    Right heart cath 08/08/2018 Findings:  RA = 7 RV = 49/8 PA = 52/17 (31) PCW = 22 Fick cardiac output/index = 5.5/2.5 PVR = 1.9 WU Ao sat = 94% PA sat = 67%, 67%  Assessment:  1. Mild pulmonary venous HTN  Plan/Discussion:  PA pressures only mildly elevated and mostly due to pulmonary venous HTN. Not candidate for selective pulmonary artery vasodilators.   Arvilla Meresaniel Bensimhon, MD   ROS - per HPI     has [name] past medical history of Cataracts, bilateral, COPD (chronic obstructive pulmonary disease) (HCC), Hearing loss, Macular degeneration, and Rheumatoid arthritis (HCC).   reports that he quit smoking about 15 years ago. His smoking use included cigarettes. He has [name] 75.00 pack-year smoking history. He has never used smokeless tobacco.  Past Surgical History:  Procedure Laterality Date  . LUNG BIOPSY    . RIGHT HEART CATH N/[name] 08/08/2018   Procedure: RIGHT HEART CATH;  Surgeon: Dolores PattyBensimhon, Daniel R, MD;  [facility];  Service: Cardiovascular;  Laterality: N/[name];    No Known Allergies  Immunization History  Administered Date(s) Administered  . Pneumococcal  Conjugate-13 02/20/2013  .  Pneumococcal Polysaccharide-23 03/26/2014    No family history on file.   Current Outpatient Medications:  .  Artificial Tear Ointment (LUBRICANT EYE OP), Place 1-2 drops into both eyes daily. Gel Drops, Disp: , Rfl:  .  furosemide (LASIX) 20 MG tablet, Take 20 mg by mouth daily as needed for edema. , Disp: , Rfl: 1 .  ibuprofen (ADVIL,MOTRIN) 200 MG tablet, Take 600 mg by mouth every 6 (six) hours as needed., Disp: , Rfl:  .  mirtazapine (REMERON) 30 MG tablet, Take 30 mg by mouth at bedtime. , Disp: , Rfl:  .  mometasone-formoterol (DULERA) 100-5 MCG/ACT AERO, Inhale 2 puffs into the lungs 2 (two) times daily., Disp: , Rfl:  .  Multiple Vitamins-Minerals (PRESERVISION AREDS 2+MULTI VIT) CAPS, Take 1 capsule by mouth 2 (two) times daily., Disp: , Rfl:  .  Nintedanib (OFEV) 150 MG CAPS, Take 150 mg by mouth 2 (two) times daily., Disp: , Rfl:  .  Omega-3 Fatty Acids (FISH OIL) 1200 MG CAPS, Take 1,200 mg by mouth 2 (two) times daily. , Disp: , Rfl:  .  predniSONE (DELTASONE) 5 MG tablet, , Disp: , Rfl:  .  Turmeric 500 MG CAPS, Take 500 mg by mouth daily. , Disp: , Rfl:       Objective:   Vitals:   08/21/18 0948  BP: 120/70  Pulse: 100  SpO2: 91%  Weight: 225 lb 12.8 oz (102.4 kg)  Height: 5\' 11"  (1.803 m)    Estimated body mass index is 31.49 kg/m as calculated from the following:   Height as of this encounter: 5\' 11"  (1.803 m).   Weight as of this encounter: 225 lb 12.8 oz (102.4 kg).  @WEIGHTCHANGE @  [employer]   08/21/18 0948  Weight: 225 lb 12.8 oz (102.4 kg)     Physical Exam  General Appearance:    Alert, cooperative, no distress, appears stated age - olde , Deconditioned looking - yes , OBESE  - yes, Sitting on Wheelchair -  no  Head:    Normocephalic, without obvious abnormality, atraumatic  Eyes:    PERRL, conjunctiva/corneas clear,  Ears:    Normal TM's and external ear canals, both ears  Nose:   Nares normal, septum midline,  mucosa normal, no drainage    or sinus tenderness. OXYGEN ON  - yes . Patient is @ 4L   Throat:   Lips, mucosa, and tongue normal; teeth and gums normal. Cyanosis on lips - no  Neck:   Supple, symmetrical, trachea midline, no adenopathy;    thyroid:  no enlargement/tenderness/nodules; no carotid   bruit or JVD  Back:     Symmetric, no curvature, ROM normal, no CVA tenderness  Lungs:     Distress - no , Wheeze no, Barrell Chest - no, Purse lip breathing - no, Crackles - YES   Chest Wall:    No tenderness or deformity.    Heart:    Regular rate and rhythm, S1 and S2 normal, no rub   or gallop, Murmur - no  Breast Exam:    NOT DONE  Abdomen:     Soft, non-tender, bowel sounds active all four quadrants,    no masses, no organomegaly. Visceral obesity - yesd  Genitalia:   NOT DONE  Rectal:   NOT DONE  Extremities:   Extremities - normal, Has Cane - yes, Clubbing - no, Edema - no  Pulses:   2+ and symmetric all extremities  Skin:   Stigmata of Connective  Tissue Disease - yes some stiffness  Lymph nodes:   Cervical, supraclavicular, and axillary nodes normal  Psychiatric:  Neurologic:   Pleasant -yes, Anxious - no, Flat affect - yes  CAm-ICU - neg, Alert and Oriented x 3 - yes, Moves all 4s - yes, Speech - normal, Cognition - intact           Assessment:       ICD-10-CM   1. Interstitial lung disease due to connective tissue disease (HCC) M35.8    J84.89   2. ILD (interstitial lung disease) (HCC) J84.9 Pulmonary function test    Hepatic function panel    AMB referral to pulmonary rehabilitation  3. History of asbestos exposure Z77.090   4. History of smoking 25-50 pack years Z87.891   5. History of methotrexate therapy Z92.21   6. Abrasion of ear, unspecified laterality, initial encounter S00.419A   7. Encounter for therapeutic drug monitoring Z51.81 Hepatic function panel  8. Solitary pulmonary nodule R91.1 CT Chest Wo Contrast       Plan:     Patient Instructions        ICD-10-CM   1. Interstitial lung disease due to connective tissue disease (HCC) M35.8    J84.89   2. ILD (interstitial lung disease) (HCC) J84.9   3. History of asbestos exposure Z77.090   4. History of smoking 25-50 pack years Z87.891   5. History of methotrexate therapy Z92.21   6. Abrasion of ear, unspecified laterality, initial encounter S00.419A    Interstitial lung disease due to connective tissue disease (HCC) ILD (interstitial lung disease) (HCC) History of asbestos exposure History of smoking 25-50 pack years History of methotrexate therapy  -Most likely reason for pulmonary fibrosis is the rheumatoid arthritis with other issues such as asbestos, smoking and prior methotrexate may be contributing along with previous occupations -Regardless the CT scan feature is one  of aggression (UIP pattern) -I think nintedanib is your current best option  -Ensure my medical assistant does the refill paperwork -Continue prednisone as directed by [name] for your rheumatoid arthritis [could be helpful in this situation] -Support [name] adding Remicade for your rheumatoid arthritis -In the future depending on course can consider adding [name] medication called CellCept for pulmonary fibrosis -Continue oxygen at 4 L nasal cannula at rest -Refer to pulmonary [facility] at [facility] -Take informed consent form for the ILD-Pro study (appreciate your interest in participation)  reveals of the main things but you have been referredRehab -High-dose flu shot today  Abrasion of ear, unspecified laterality, initial encounter  -This is due to the oxygen tubing and on both ear -Apply Vaseline and also contact the oxygen company to give you some kind of protection barrier   Left upper lobe nodule 2.7 cm August 04, 2018 on CT chest -We will do repeat CT chest as recommended in May 2020  Follow-up -4-6 weeks do liver function test -4-6 weeks do spirometry and DLCO -Return to ILD clinic in 4-6  weeks or sooner if needed -Order follow-up CT chest at that time for May 2020    > 50% of this > 40 min visit spent in face to face counseling or/and coordination of care - by this undersigned MD - Dr Kalman Shan. This includes one or more of the following documented above: discussion of test results, diagnostic or treatment recommendations, prognosis, risks and benefits of management options, instructions, education, compliance or risk-factor reduction    SIGNATURE    Dr. Kalman Shan, M.D.,  F.C.C.P,  Pulmonary and Critical Care Medicine Staff Physician, [facility] Director - Interstitial Lung Disease  Program  [facility] at [facility] [place], [place], [id number]  Pager: [phone], If no answer or between  15:00h - 7:00h: call [phone] Telephone: [phone]  11:14 AM 08/21/2018

## 2018-08-21 NOTE — Patient Instructions (Addendum)
     ICD-10-CM   1. Interstitial lung disease due to connective tissue disease (HCC) M35.8    J84.89   2. ILD (interstitial lung disease) (HCC) J84.9 Pulmonary function test    Hepatic function panel    AMB referral to pulmonary rehabilitation  3. History of asbestos exposure Z77.090   4. History of smoking 25-50 pack years Z87.891   5. History of methotrexate therapy Z92.21   6. Abrasion of ear, unspecified laterality, initial encounter S00.419A   7. Encounter for therapeutic drug monitoring Z51.81 Hepatic function panel  8. Solitary pulmonary nodule R91.1 CT Chest Wo Contrast   Interstitial lung disease due to connective tissue disease (HCC) ILD (interstitial lung disease) (HCC) History of asbestos exposure History of smoking 25-50 pack years History of methotrexate therapy  -Most likely reason for pulmonary fibrosis is the rheumatoid arthritis with other issues such as asbestos, smoking and prior methotrexate may be contributing along with previous occupations -Regardless the CT scan feature is one  of aggression (UIP pattern) -I think nintedanib is your current best option  -Ensure my medical assistant does the refill paperwork -Continue prednisone as directed by [name] for your rheumatoid arthritis [could be helpful in this situation] -Support [name] adding Remicade for your rheumatoid arthritis -In the future depending on course can consider adding a medication called CellCept for pulmonary fibrosis -Continue oxygen at 4 L nasal cannula at rest -Refer to pulmonary rehabilitation at [facility] -Take informed consent form for the ILD-Pro study (appreciate your interest in participation)  reveals of the main things but you have been referredRehab -High-dose flu shot today  Abrasion of ear, unspecified laterality, initial encounter  -This is due to the oxygen tubing and on both ear -Apply Vaseline and also contact the oxygen company to give you some kind of protection  barrier   Left upper lobe nodule 2.7 cm August 04, 2018 on CT chest -We will do repeat CT chest as recommended in May 2020  Follow-up -4-6 weeks do liver function test -4-6 weeks do spirometry and DLCO -Return to ILD clinic in 4-6 weeks or sooner if needed -Order follow-up CT chest at that time for May 2020

## 2018-09-01 DIAGNOSIS — M0589 Other rheumatoid arthritis with rheumatoid factor of multiple sites: Secondary | ICD-10-CM | POA: Diagnosis not present

## 2018-09-15 DIAGNOSIS — M0589 Other rheumatoid arthritis with rheumatoid factor of multiple sites: Secondary | ICD-10-CM | POA: Diagnosis not present

## 2018-09-16 ENCOUNTER — Telehealth: Payer: Self-pay | Admitting: Internal Medicine

## 2018-09-16 NOTE — Telephone Encounter (Signed)
CT order if for May.  Verified with [facility] MR said follow up to see him in 4-6 weeks and CT to be repeated in May.  Called pt and spoke to wife and gave her this info.  She states ok.  Nothing further needed.

## 2018-09-17 DIAGNOSIS — H2511 Age-related nuclear cataract, right eye: Secondary | ICD-10-CM | POA: Diagnosis not present

## 2018-09-17 DIAGNOSIS — H353134 Nonexudative age-related macular degeneration, bilateral, advanced atrophic with subfoveal involvement: Secondary | ICD-10-CM | POA: Diagnosis not present

## 2018-09-23 ENCOUNTER — Ambulatory Visit: Payer: Medicare Other | Admitting: Internal Medicine

## 2018-10-08 ENCOUNTER — Encounter (HOSPITAL_COMMUNITY): Payer: Medicare Other

## 2018-10-13 DIAGNOSIS — M0589 Other rheumatoid arthritis with rheumatoid factor of multiple sites: Secondary | ICD-10-CM | POA: Diagnosis not present

## 2018-11-03 DIAGNOSIS — E782 Mixed hyperlipidemia: Secondary | ICD-10-CM | POA: Diagnosis not present

## 2018-11-03 DIAGNOSIS — I10 Essential (primary) hypertension: Secondary | ICD-10-CM | POA: Diagnosis not present

## 2018-11-13 ENCOUNTER — Ambulatory Visit (HOSPITAL_COMMUNITY): Payer: Medicare Other

## 2018-11-13 DIAGNOSIS — H2511 Age-related nuclear cataract, right eye: Secondary | ICD-10-CM | POA: Diagnosis not present

## 2018-11-13 DIAGNOSIS — H25011 Cortical age-related cataract, right eye: Secondary | ICD-10-CM | POA: Diagnosis not present

## 2018-11-18 ENCOUNTER — Other Ambulatory Visit: Payer: Self-pay

## 2018-11-18 ENCOUNTER — Ambulatory Visit (HOSPITAL_COMMUNITY)
Admission: RE | Admit: 2018-11-18 | Discharge: 2018-11-18 | Disposition: A | Discharge status: Home / Self Care | Payer: Medicare Other | Source: Ambulatory Visit | Attending: Internal Medicine | Admitting: Internal Medicine

## 2018-11-18 DIAGNOSIS — R911 Solitary pulmonary nodule: Secondary | ICD-10-CM | POA: Diagnosis not present

## 2018-11-26 DIAGNOSIS — H2512 Age-related nuclear cataract, left eye: Secondary | ICD-10-CM | POA: Diagnosis not present

## 2018-11-28 DIAGNOSIS — H353231 Exudative age-related macular degeneration, bilateral, with active choroidal neovascularization: Secondary | ICD-10-CM | POA: Diagnosis not present

## 2018-12-04 DIAGNOSIS — H2512 Age-related nuclear cataract, left eye: Secondary | ICD-10-CM | POA: Diagnosis not present

## 2018-12-04 DIAGNOSIS — H25012 Cortical age-related cataract, left eye: Secondary | ICD-10-CM | POA: Diagnosis not present

## 2018-12-08 DIAGNOSIS — M0589 Other rheumatoid arthritis with rheumatoid factor of multiple sites: Secondary | ICD-10-CM | POA: Diagnosis not present

## 2018-12-30 DIAGNOSIS — J8410 Pulmonary fibrosis, unspecified: Secondary | ICD-10-CM | POA: Diagnosis not present

## 2018-12-30 DIAGNOSIS — Z79899 Other long term (current) drug therapy: Secondary | ICD-10-CM | POA: Diagnosis not present

## 2018-12-30 DIAGNOSIS — M2550 Pain in unspecified joint: Secondary | ICD-10-CM | POA: Diagnosis not present

## 2018-12-30 DIAGNOSIS — J449 Chronic obstructive pulmonary disease, unspecified: Secondary | ICD-10-CM | POA: Diagnosis not present

## 2018-12-30 DIAGNOSIS — M0589 Other rheumatoid arthritis with rheumatoid factor of multiple sites: Secondary | ICD-10-CM | POA: Diagnosis not present

## 2019-02-02 DIAGNOSIS — M0589 Other rheumatoid arthritis with rheumatoid factor of multiple sites: Secondary | ICD-10-CM | POA: Diagnosis not present

## 2019-03-04 ENCOUNTER — Encounter: Payer: Self-pay | Admitting: Internal Medicine

## 2019-03-04 ENCOUNTER — Ambulatory Visit (INDEPENDENT_AMBULATORY_CARE_PROVIDER_SITE_OTHER): Payer: Medicare Other | Admitting: Internal Medicine

## 2019-03-04 ENCOUNTER — Other Ambulatory Visit: Payer: Self-pay

## 2019-03-04 VITALS — BP 130/68 | HR 98 | Temp 98.0°F | Ht 71.0 in | Wt 228.2 lb

## 2019-03-04 DIAGNOSIS — M358 Other specified systemic involvement of connective tissue: Secondary | ICD-10-CM

## 2019-03-04 DIAGNOSIS — Z77090 Contact with and (suspected) exposure to asbestos: Secondary | ICD-10-CM | POA: Diagnosis not present

## 2019-03-04 DIAGNOSIS — Z23 Encounter for immunization: Secondary | ICD-10-CM | POA: Diagnosis not present

## 2019-03-04 DIAGNOSIS — J849 Interstitial pulmonary disease, unspecified: Secondary | ICD-10-CM

## 2019-03-04 DIAGNOSIS — Z9221 Personal history of antineoplastic chemotherapy: Secondary | ICD-10-CM

## 2019-03-04 DIAGNOSIS — Z87891 Personal history of nicotine dependence: Secondary | ICD-10-CM | POA: Diagnosis not present

## 2019-03-04 DIAGNOSIS — Z5181 Encounter for therapeutic drug level monitoring: Secondary | ICD-10-CM | POA: Diagnosis not present

## 2019-03-04 DIAGNOSIS — R0602 Shortness of breath: Secondary | ICD-10-CM

## 2019-03-04 DIAGNOSIS — J8489 Other specified interstitial pulmonary diseases: Secondary | ICD-10-CM

## 2019-03-04 DIAGNOSIS — R911 Solitary pulmonary nodule: Secondary | ICD-10-CM | POA: Diagnosis not present

## 2019-03-04 DIAGNOSIS — M359 Systemic involvement of connective tissue, unspecified: Secondary | ICD-10-CM

## 2019-03-04 LAB — HEPATIC FUNCTION PANEL
ALT: 18 U/L (ref 0–53)
AST: 21 U/L (ref 0–37)
Albumin: 4.2 g/dL (ref 3.5–5.2)
Alkaline Phosphatase: 61 U/L (ref 39–117)
Bilirubin, Direct: 0 mg/dL (ref 0.0–0.3)
Total Bilirubin: 0.4 mg/dL (ref 0.2–1.2)
Total Protein: 7.6 g/dL (ref 6.0–8.3)

## 2019-03-04 NOTE — Progress Notes (Addendum)
HPI   [name] presents for interstitial lung disease evaluation.  He is being taken care of by a pulmonologist Dr Judith PartBitar in [place], [place] but he relocated to [place] and patient is now without a pulmonologist.  Therefore he is been referred to the ILD center here  IOV 07/22/2018  [facility] Comprehensive ILD Questionnaire  Symptoms: As best as I can gather in 2008 he had a right necrotizing pneumonia with a chest tube as evidenced by my review of the chart and my visualization of the CT scan.  He does not recollect this much.  He says his been diagnosed with interstitial lung disease starting spring 2019 [prior to that for many years he was just on nighttime oxygen for sleep apnea] and then starting May 2019 he has been on nintedanib.  Sometime around this time he also diagnosed with rheumatoid arthritis.  He says he took a few days of methotrexate but this made his breathing worse.  Elpidio AnisErin Gray [facility] is now considering Remicade but this is not been started yet.  He is currently on daily prednisone 5 mg/day but I do not know if this is for lung disease or for rheumatoid arthritis.  At this point in time he is on 3 L of oxygen continuously.  Today when we turned his oxygen off on room air at rest for 20 minutes his pulse ox was 85% on room air with a finger and forehead probe.  In terms of his own history he says he has had insidious onset of shortness of breath for the last 14 years.  It is been progressive for the last 1 year.  Present on exertion relieved by rest.  When he takes a shower or dress this is a level 2 dyspnea.  When he does household work or shopping it is level 3.  When walking up stairs or walking up a hill it is level 1 but this is with oxygen.  He only has cough occasionally.  And occasionally he is wheezy.   Past Medical History : Diagnosis rheumatoid arthritis for few years although at one point in time he told  me it only for the last 1 year.  Denies any other autoimmune disease.  Did not answer anything about diabetes or acid reflux.  Noticed to be on fish oil.  Denied heart disease.  He did see Dr. Gala RomneyBensimhon in cardiology in September 2019.  Echocardiogram shows severe pulmonary artery systolic pressure elevation to 80 mmHg.   ROS: Significant for joint pain for few years.   FAMILY HISTORY of LUNG DISEASE: Denies any family history of pulmonary fibrosis   EXPOSURE HISTORY: Smokes cigarettes since his teenage years.  He states he quit smoking in 2005 smoking 2 to 3 packs/day.  Although today he did smell of tobacco suspect ongoing smoking.  Denies any marijuana or electronic cigarettes or vaping.  No intravenous drug use.   HOME and HOBBY DETAILS : Lives in an apartment for 8 months.  No advance living environment.  No mold or mildew in the house.  No humidifier use.  No CPAP use.  [Is not able to tolerate CPAP/BiPAP] no nebulizer use.  No steam iron.  No Jacuzzi use.  No fountain inside the house.  No pet birds or parakeets.  No hamsters.  No feather pillow use.  No feather blanket use.  No [facility].  No guarding habits no music habits.   OCCUPATIONAL  HISTORY (122 questions) : He did have asbestos exposure in the past working in Holiday representativeconstruction.  He did go tobacco as a young adult.  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  During this time was exposed to dusty environment   PULMONARY TOXICITY HISTORY (27 items): In 2019 for a few days took methotrexate according to his history but this made his dyspnea worse.  Has been on prednisone for 2 years.  Serial pulmonary function testing listed below.    Results for David GuildJARRETT, Zerek A (MRN [id number]) as of 07/22/2018 14:56  Ref. Range 11/01/27 - [place] at time of ofev 03/20/2018  11:28 06/19/18 [place] per notes  FVC-Pre Latest Units: L 1.75L 1.61 1.77L/  FVC-%Pred-Pre Latest Units: %  36 47%  FEV1-Pre Latest Units: L  1.32   FEV1-%Pred-Pre Latest Units: %  40   Pre FEV1/FVC ratio Latest Units: %  82   Results for David GuildJARRETT, Krystopher A (MRN [id number]) as of 07/22/2018 14:56  Ref. Range 03/20/2018 11:28  DLCO unc  Latest Units: ml/min/mmHg 6.59  DLCO unc % pred Latest Units: % 19    ECHO 03/20/18 Pulmonary arteries: Systolic pressure was severely increased. PA   peak pressure: 81 mm Hg (S). - Impressions: There is moderate to severe pulmonary HTN with RV   strain.  Impression  - There is moderate to severe pulmonary HTN with RV strain.      OV 08/21/2018  Subjective:  Patient ID: David GuildRussell A Walther, male , DOB: 02/06/1946 , age 73 y.o. , MRN: [id number] , ADDRESS: [address] [place] [id number]   08/21/2018 -   Chief Complaint  Patient presents with  . Follow-up    [name] and is still having problems with breathing when he exerts himself. Pt also has an occ dry cough and has had some mild CP.  DME: [employer], 4 pulse with exertion and 4 continuous at home.     HPI David GuildRussell A Behrman 73 y.o. -presents to ILD clinic for follow-up.  He presents with his wife  The main question is the etiology of his interstitial lung disease.  He has a history of rheumatoid arthritis, occupational exposures and age and male gender can put him at risk for UIP/IPF.  In addition in the past he has had methotrexate previous smoker.  Therefore we will subjected him to a work-up.  His previous pulmonologist had already started him on nintedanib.  He had a high-resolution CT chest that is classic UIP.   The next main question was 1 of treatment.  He is currently on nintedanib.  In the interim his rheumatology PA Elpidio Anisrin Gray called me.  She reported the patient has significant pain from rheumatoid arthritis and felt that CellCept or  Imuran would not control the pain.  She is referred Remicade and she would adjust the prednisone.  I supported the plan.  Patient states he only has mild to moderate stiffness at this point of his joints.   The next issue is significant symptomatology of dyspnea.  The dyspnea scale is listed below.  This is quite disabling.  Therefore we did a blood gas to look for hypercapnia in case he needed nighttime BiPAP but there is no carbon dioxide retention on the blood  gas below.  In addition I corresponded with Dr. Gala Romney and he did a right heart catheterization and there is only mild pulmonary venous hypertension.  Patient has never been to rehabilitation and is willing to go  He has a new issue of left upper lobe nodular nodule 2.7 cm.  Radiologist recommending CT chest in 3 months.  New complaint is that the oxygen tubing is causing abrasion of both ear on the pinna bilaterally.  He applies Neosporin sporadically.         IMPRESSION: 1. Spectrum of findings compatible with severe fibrotic interstitial lung disease with advanced honeycombing. Although the distribution is slightly atypical, there are no disqualifying features such as air trapping or nodularity, and the findings are considered most compatible with usual interstitial pneumonia (UIP). Findings are consistent with UIP per consensus guidelines: Diagnosis of Idiopathic Pulmonary Fibrosis: An Official ATS/ERS/JRS/ALAT Clinical Practice Guideline. Am Rosezetta Schlatter Crit Care Med Vol 198, Iss 5, [phone], Mar 02 2017. 2. Mild mediastinal and bilateral hilar adenopathy, nonspecific, most likely reactive. 3. Poorly marginated nodular 2.7 cm focus of consolidation in the left upper lobe is nonspecific, favor focal fibrotic change, cannot exclude neoplasm. Suggest attention on follow-up chest CT in 3 months. 4. Two-vessel coronary atherosclerosis.  Aortic Atherosclerosis (ICD10-I70.0) and Emphysema (ICD10-J43.9).   Electronically  Signed   By: Delbert Phenix M.D.   On: 08/04/2018 10:28   Results for RONON, FERGER (MRN [id number]) as of 08/21/2018 10:44  Ref. Range 08/01/2018 12:44  pH, Arterial Latest Ref Range: 7.350 - 7.450  7.439  pCO2 arterial Latest Ref Range: 32.0 - 48.0 mmHg 42.6  pO2, Arterial Latest Ref Range: 83.0 - 108.0 mmHg 50.8 (L)    Right heart cath 08/08/2018 Findings:  RA = 7 RV = 49/8 PA = 52/17 (31) PCW = 22 Fick cardiac output/index = 5.5/2.5 PVR = 1.9 WU Ao sat = 94% PA sat = 67%, 67%  Assessment:  1. Mild pulmonary venous HTN  Plan/Discussion:  PA pressures only mildly elevated and mostly due to pulmonary venous HTN. Not candidate for selective pulmonary artery vasodilators.   Arvilla Meres, MD   ROS - per HPI   OV 03/04/2019  Subjective:  Patient ID: JAFET WISSING, male , DOB: 04-Feb-1946 , age 39 y.o. , MRN: [id number] , ADDRESS: [address] [place] [address]   03/04/2019 -   Chief Complaint  Patient presents with  . Interstitial lung disease due to connective tissue disease    Feels like his breathing is worse since last visit. Gets short of breath after walking 30 feet or less.   Follow-up progressive interstitial lung disease UIP pattern secondary to rheumatoid arthritis and also in the setting of previous asbestos exposure and methotrexate therapy.  On nintedanib. On Remicade for rheumatoid arthritis  Follow-up left upper lobe 2.7 cm nodule February 2020  -unchanged May 2020.  HPI JAIREN GOLDFARB 73 y.o. -presents with his wife for the above problems.  He has progressive worsening of dyspnea as documented below on his dyspnea score.  He is using 5 L of oxygen at rest.  He uses a 3 L portable oxygen but states that he has class III dyspnea that is severe.  He feels he might be desaturating although he has not tested himself for that.  He is in need of a flu shot.  He has been taking nintedanib for several months.  He says he is not had any  single side effect from it.  He says he is compliant with it.  For his rheumatoid arthritis his rheumatologist now has him on Remicade and his wife and he confirmed that he is taking and getting infusions on a regular basis.  He is really concerned about his progressive dyspnea.  And a CT scan of the chest in May 2020 for the nodule regression of fibrosis was not reported.  His echocardiogram in 2019 shows good ejection fraction although he has diastolic dysfunction.   Walked him 185 feet x 3 laps on 3L [place] portable - did only 1 lap and desaturated to 83%. Needed 5L [place] to correct   SYMPTOM SCALE - ILD 08/21/2018  03/04/2019   O2 use 4LNC at rest 3 L [place] portable + 5L [place] at night rest  Shortness of Breath 0 -> 5 scale with 5 being worst (score 6 If unable to do)   At rest 1 0  Simple tasks - showers, clothes change, eating, shaving 4 5  Household (dishes, doing bed, laundry) 3 5  Shopping 3 5  Walking level at own pace 3 5  Walking keeping up with others of same age 75 5  Walking up Stairs 3 5  Walking up [place] 3 5  Total (40 - 48) Dyspnea Score 24 35  How bad is your cough? x   How bad is your fatigue x       CT chest May 2020  IMPRESSION: 1. Stable 2.7 cm ill-defined nodular focus of consolidation in left upper lobe, with associated bronchiectatic air bronchograms. This likely represents an area of confluent fibrosis, with neoplasm considered much less likely. Continued followup by chest CT without contrast recommended in 6 months. 2. Stable mild mediastinal and bilateral hilar lymphadenopathy, likely reactive in etiology. 3. Stable chronic pulmonary interstitial fibrosis with advanced honeycombing.  Aortic Atherosclerosis (ICD10-I70.0). Coronary artery atherosclerosis.   Electronically Signed   By: Earle Gell M.D.   On: 11/19/2018 13:44  ROS - per HPI     has a past medical history of Cataracts, bilateral, COPD (chronic obstructive pulmonary disease) ([place]), Hearing  loss, Macular degeneration, and Rheumatoid arthritis ([place]).   reports that he quit smoking about 15 years ago. His smoking use included cigarettes. He has a 75.00 pack-year smoking history. He has never used smokeless tobacco.  Past Surgical History:  Procedure Laterality Date  . LUNG BIOPSY    . RIGHT HEART CATH N/A 08/08/2018   Procedure: RIGHT HEART CATH;  Surgeon: Jolaine Artist, MD;  Location: [facility];  Service: Cardiovascular;  Laterality: N/A;    No Known Allergies  Immunization History  Administered Date(s) Administered  . Influenza, High Dose Seasonal PF 08/21/2018  . Pneumococcal Conjugate-13 02/20/2013  . Pneumococcal Polysaccharide-23 03/26/2014    No family history on file.   Current Outpatient Medications:  .  Artificial Tear Ointment (LUBRICANT EYE OP), Place 1-2 drops into both eyes daily. Gel Drops, Disp: , Rfl:  .  furosemide (LASIX) 20 MG tablet, Take 20 mg by mouth daily as needed for edema. , Disp: , Rfl: 1 .  ibuprofen (ADVIL,MOTRIN) 200 MG tablet, Take 600 mg by mouth every 6 (six) hours as needed., Disp: , Rfl:  .  mirtazapine (REMERON) 30 MG tablet, Take 30 mg by mouth at bedtime. , Disp: , Rfl:  .  mometasone-formoterol (DULERA) 100-5 MCG/ACT AERO, Inhale 2 puffs into the lungs 2 (two) times daily., Disp: , Rfl:  .  Multiple Vitamins-Minerals (PRESERVISION AREDS 2+MULTI VIT) CAPS, Take 1  capsule by mouth 2 (two) times daily., Disp: , Rfl:  .  Nintedanib (OFEV) 150 MG CAPS, Take 150 mg by mouth 2 (two) times daily., Disp: , Rfl:  .  Omega-3 Fatty Acids (FISH OIL) 1200 MG CAPS, Take 1,200 mg by mouth 2 (two) times daily. , Disp: , Rfl:  .  predniSONE (DELTASONE) 5 MG tablet, , Disp: , Rfl:  .  Turmeric 500 MG CAPS, Take 500 mg by mouth daily. , Disp: , Rfl:       Objective:   Vitals:   03/04/19 1145  BP: 130/68  Pulse: 98  Temp: 98 F (36.7 C)  SpO2: 92%  Weight: 228 lb 3.2 oz (103.5 kg)  Height: 5\' 11"  (1.803 m)    Estimated  body mass index is 31.83 kg/m as calculated from the following:   Height as of this encounter: 5\' 11"  (1.803 m).   Weight as of this encounter: 228 lb 3.2 oz (103.5 kg).  @WEIGHTCHANGE @    03/04/19 1145  Weight: 228 lb 3.2 oz (103.5 kg)     Physical Exam  General Appearance:    Alert, cooperative, no distress, appears stated age - older , Deconditioned looking - yes , OBESE  - yes, Sitting on Wheelchair -  no  Head:    Normocephalic, without obvious abnormality, atraumatic  Eyes:    PERRL, conjunctiva/corneas clear,  Ears:    Normal TM's and external ear canals, both ears  Nose:   Nares normal, septum midline, mucosa normal, no drainage    or sinus tenderness. OXYGEN ON  - yes . Patient is @ 3LNC   Throat: MASK  Neck:   Supple, symmetrical, trachea midline, no adenopathy;    thyroid:  no enlargement/tenderness/nodules; no carotid   bruit or JVD  Back:     Symmetric, no curvature, ROM normal, no CVA tenderness  Lungs:     Distress - no , Wheeze no, Barrell Chest - no, Purse lip breathing - no, Crackles - yes   Chest Wall:    No tenderness or deformity.    Heart:    Regular rate and rhythm, S1 and S2 normal, no rub   or gallop, Murmur - no  Breast Exam:    NOT DONE  Abdomen:     Soft, non-tender, bowel sounds active all four quadrants,    no masses, no organomegaly. Visceral obesity - yes  Genitalia:   NOT DONE  Rectal:   NOT DONE  Extremities:   Extremities - normal, Has Cane - no, Clubbing - no, Edema - no  Pulses:   2+ and symmetric all extremities  Skin:   Stigmata of Connective Tissue Disease - maybe  Lymph nodes:   Cervical, supraclavicular, and axillary nodes normal  Psychiatric:  Neurologic:   Pleasant - yes, Anxious - bi, Flat affect - maybe  CAm-ICU - neg, Alert and Oriented x 3 - yes, Moves all 4s - yes, Speech - normal, Cognition - intact           Assessment:       ICD-10-CM   1. Shortness of breath  R06.02 Hepatic function panel  2.  Interstitial lung disease due to connective tissue disease (HCC)  M35.8    J84.89   3. History of asbestos exposure  Z77.090   4. History of smoking 25-50 pack years  Z87.891   5. History of methotrexate therapy  Z92.21   6. Encounter for therapeutic drug monitoring  Z51.81   7.  Solitary pulmonary nodule  R91.1        Plan:     Patient Instructions   Interstitial lung disease due to connective tissue disease (HCC) ILD (interstitial lung disease) (HCC) History of asbestos exposure History of smoking 25-50 pack years History of methotrexate therapy  -Most likely reason for pulmonary fibrosis is the rheumatoid arthritis with other issues such as asbestos, smoking and prior methotrexate may be contributing along with previous occupations   - Disease might be worse with time   PLAN  - walk test 03/04/2019 on 3L [place] portable to see distance to desaturatioin  - high dose flu shot 03/04/2019 - check LFT 03/04/2019 for ofev monitoring  - continue ofev (glad your are tolerating it well) - continue 3-5 LNC o2 - do spirometry and dlco in nov 2020  - do HRCT in late Nov 2020 - are you attending pulmonary rehab at [facility]? If not will refer    Left upper lobe nodule 2.7 cm August 04, 2018 and May 2020 on CT chest and -We will do repeat CT chest as above in Nov 2020 (6 months from may 2020)   Follow-up -Late Nov 2020 but after spiromerty test and ct   - 30 min slot for ILD      > 50% of this > 25 min visit spent in face to face counseling or coordination of care - by this undersigned MD - Dr Kalman Shan. This includes one or more of the following documented above: discussion of test results, diagnostic or treatment recommendations, prognosis, risks and benefits of management options, instructions, education, compliance or risk-factor reduction     SIGNATURE    Dr. Kalman Shan, M.D., F.C.C.P,  Pulmonary and Critical Care Medicine Staff Physician, [facility]  Center Director - Interstitial Lung Disease  Program  Pulmonary Fibrosis [facility] at [facility] [place], [place], [address]  Pager: [phone], If no answer or between  15:00h - 7:00h: call [phone] Telephone: [phone]  12:13 PM 03/04/2019

## 2019-03-04 NOTE — Patient Instructions (Addendum)
  Interstitial lung disease due to connective tissue disease (HCC) ILD (interstitial lung disease) ([place]) History of asbestos exposure History of smoking 25-50 pack years History of methotrexate therapy  -Most likely reason for pulmonary fibrosis is the rheumatoid arthritis with other issues such as asbestos, smoking and prior methotrexate may be contributing along with previous occupations   - Disease might be worse with time   PLAN  - start pro-air or albuterol mdi as needed for dyspnea relief -  walk test 03/04/2019 on 3L [place] portable to see distance to desaturatioin  - high dose flu shot 03/04/2019 - check LFT 03/04/2019 for ofev monitoring  - continue ofev (glad your are tolerating it well) - continue 3-5 LNC o2 - do spirometry and dlco in nov 2020  - do HRCT in late Nov 2020 - are you attending pulmonary rehab at [facility]? If not will refer    Left upper lobe nodule 2.7 cm August 04, 2018 and May 2020 on CT chest and -We will do repeat CT chest as above in Nov 2020 (6 months from may 2020)   Follow-up -Late Nov 2020 but after spiromerty test and ct   - 30 min slot for ILD

## 2019-03-06 ENCOUNTER — Telehealth: Payer: Self-pay | Admitting: Internal Medicine

## 2019-03-06 NOTE — Telephone Encounter (Signed)
LMTCB x1 for pt.  

## 2019-03-06 NOTE — Telephone Encounter (Signed)
Notes recorded by Brand Males, MD on 03/05/2019 at 2:22 PM EDT  LFT nromal ------------------------------------ LMTCB x1 for pt.

## 2019-03-06 NOTE — Telephone Encounter (Signed)
Pt returning call says he doesn';t here phone ring.[name]

## 2019-03-10 NOTE — Telephone Encounter (Signed)
Pt returning call for test results and can be reached @ [phone].[name]

## 2019-03-10 NOTE — Telephone Encounter (Signed)
Notes recorded by Brand Males, MD on 03/05/2019 at 2:22 PM EDT  LFT nromal  Pt notified. Nothing further needed.

## 2019-03-10 NOTE — Telephone Encounter (Signed)
Left message for patient to call back  

## 2019-03-30 DIAGNOSIS — Z79899 Other long term (current) drug therapy: Secondary | ICD-10-CM | POA: Diagnosis not present

## 2019-03-30 DIAGNOSIS — M0589 Other rheumatoid arthritis with rheumatoid factor of multiple sites: Secondary | ICD-10-CM | POA: Diagnosis not present

## 2019-04-02 DIAGNOSIS — J449 Chronic obstructive pulmonary disease, unspecified: Secondary | ICD-10-CM | POA: Diagnosis not present

## 2019-04-02 DIAGNOSIS — M2550 Pain in unspecified joint: Secondary | ICD-10-CM | POA: Diagnosis not present

## 2019-04-02 DIAGNOSIS — Z6832 Body mass index (BMI) 32.0-32.9, adult: Secondary | ICD-10-CM | POA: Diagnosis not present

## 2019-04-02 DIAGNOSIS — J8410 Pulmonary fibrosis, unspecified: Secondary | ICD-10-CM | POA: Diagnosis not present

## 2019-04-02 DIAGNOSIS — Z79899 Other long term (current) drug therapy: Secondary | ICD-10-CM | POA: Diagnosis not present

## 2019-04-02 DIAGNOSIS — E669 Obesity, unspecified: Secondary | ICD-10-CM | POA: Diagnosis not present

## 2019-04-02 DIAGNOSIS — M0589 Other rheumatoid arthritis with rheumatoid factor of multiple sites: Secondary | ICD-10-CM | POA: Diagnosis not present

## 2019-04-08 ENCOUNTER — Telehealth: Payer: Self-pay | Admitting: Internal Medicine

## 2019-04-08 NOTE — Telephone Encounter (Signed)
reviwed noted from Rheumatology 04/02/2019 -they are very concerned about worsening interstitial lung disease despite nintedanib and Remicade.  I see that he has an appointment with PFT mid November 2020.  This will be fine.  However, if there is an earlier to 30-minute time slot available please see if he can come earlier  If it is an issue of accommodating pulmonary function testing he can get this done at [facility] just a spirometry and DLCO before he comes here.  This is because he lives in [place] and he can do it 1 or 2 weeks before he sees me

## 2019-04-09 ENCOUNTER — Other Ambulatory Visit: Payer: Self-pay | Admitting: Pulmonary Disease

## 2019-04-10 NOTE — Telephone Encounter (Signed)
LMTCB

## 2019-04-14 NOTE — Telephone Encounter (Signed)
lmtcb X2 for pt.  

## 2019-05-11 NOTE — Telephone Encounter (Signed)
Attempted to call pt but unable to reach. Left message for pt to return call. 

## 2019-05-12 ENCOUNTER — Telehealth: Payer: Self-pay | Admitting: Internal Medicine

## 2019-05-12 NOTE — Telephone Encounter (Signed)
There is a DPR in his chart from earlier this year that includes Joaquim Lai. Left message for Joaquim Lai to call back.

## 2019-05-12 NOTE — Telephone Encounter (Signed)
Pt's ex-wife returning call.  Pt unable to hear well.  No DPR that I can find listed.  [phone] Joaquim Lai Covin/ex-wife).

## 2019-05-12 NOTE — Telephone Encounter (Signed)
Spoke with pt, advised him that there wasn't any notes in the system about a call. He stated he had his PFT and covid test scheduled along with his CT at [facility]. I advised pt that we would call back if we needed anything. He understood and nothing further is needed.

## 2019-05-18 ENCOUNTER — Other Ambulatory Visit: Payer: Self-pay

## 2019-05-18 ENCOUNTER — Other Ambulatory Visit (HOSPITAL_COMMUNITY)
Admission: RE | Admit: 2019-05-18 | Discharge: 2019-05-18 | Disposition: A | Discharge status: Home / Self Care | Payer: Medicare Other | Source: Ambulatory Visit | Attending: Internal Medicine | Admitting: Internal Medicine

## 2019-05-18 ENCOUNTER — Ambulatory Visit (HOSPITAL_COMMUNITY)
Admission: RE | Admit: 2019-05-18 | Discharge: 2019-05-18 | Disposition: A | Discharge status: Home / Self Care | Payer: Medicare Other | Source: Ambulatory Visit | Attending: Internal Medicine | Admitting: Internal Medicine

## 2019-05-18 DIAGNOSIS — I700 Atherosclerosis of aorta: Secondary | ICD-10-CM | POA: Insufficient documentation

## 2019-05-18 DIAGNOSIS — Z20828 Contact with and (suspected) exposure to other viral communicable diseases: Secondary | ICD-10-CM | POA: Insufficient documentation

## 2019-05-18 DIAGNOSIS — J849 Interstitial pulmonary disease, unspecified: Secondary | ICD-10-CM | POA: Insufficient documentation

## 2019-05-18 DIAGNOSIS — R0602 Shortness of breath: Secondary | ICD-10-CM | POA: Diagnosis not present

## 2019-05-18 DIAGNOSIS — J479 Bronchiectasis, uncomplicated: Secondary | ICD-10-CM | POA: Insufficient documentation

## 2019-05-18 DIAGNOSIS — I2510 Atherosclerotic heart disease of native coronary artery without angina pectoris: Secondary | ICD-10-CM | POA: Diagnosis not present

## 2019-05-18 LAB — SARS CORONAVIRUS 2 (TAT 6-24 HRS): SARS Coronavirus 2: NEGATIVE

## 2019-05-20 NOTE — Telephone Encounter (Signed)
See phone encounter from 11/10

## 2019-05-21 ENCOUNTER — Ambulatory Visit (INDEPENDENT_AMBULATORY_CARE_PROVIDER_SITE_OTHER): Payer: Medicare Other | Admitting: Internal Medicine

## 2019-05-21 ENCOUNTER — Encounter: Payer: Self-pay | Admitting: Internal Medicine

## 2019-05-21 ENCOUNTER — Other Ambulatory Visit: Payer: Self-pay

## 2019-05-21 VITALS — BP 126/74 | HR 108 | Ht 70.5 in | Wt 235.0 lb

## 2019-05-21 DIAGNOSIS — R0602 Shortness of breath: Secondary | ICD-10-CM | POA: Diagnosis not present

## 2019-05-21 DIAGNOSIS — J8489 Other specified interstitial pulmonary diseases: Secondary | ICD-10-CM

## 2019-05-21 DIAGNOSIS — Z77090 Contact with and (suspected) exposure to asbestos: Secondary | ICD-10-CM

## 2019-05-21 DIAGNOSIS — J9611 Chronic respiratory failure with hypoxia: Secondary | ICD-10-CM | POA: Diagnosis not present

## 2019-05-21 DIAGNOSIS — J849 Interstitial pulmonary disease, unspecified: Secondary | ICD-10-CM

## 2019-05-21 DIAGNOSIS — M359 Systemic involvement of connective tissue, unspecified: Secondary | ICD-10-CM | POA: Diagnosis not present

## 2019-05-21 DIAGNOSIS — R911 Solitary pulmonary nodule: Secondary | ICD-10-CM | POA: Diagnosis not present

## 2019-05-21 DIAGNOSIS — I2729 Other secondary pulmonary hypertension: Secondary | ICD-10-CM

## 2019-05-21 LAB — D-DIMER, QUANTITATIVE: D-Dimer, Quant: 0.28 mcg/mL FEU (ref <0.50)

## 2019-05-21 LAB — HEPATIC FUNCTION PANEL
ALT: 22 U/L (ref 0–53)
AST: 18 U/L (ref 0–37)
Albumin: 4.1 g/dL (ref 3.5–5.2)
Alkaline Phosphatase: 57 U/L (ref 39–117)
Bilirubin, Direct: 0.1 mg/dL (ref 0.0–0.3)
Total Bilirubin: 0.7 mg/dL (ref 0.2–1.2)
Total Protein: 7.5 g/dL (ref 6.0–8.3)

## 2019-05-21 LAB — PULMONARY FUNCTION TEST
DL/VA % pred: 60 %
DL/VA: 2.41 ml/min/mmHg/L
DLCO unc % pred: 45 %
DLCO unc: 11.64 ml/min/mmHg
FEF 25-75 Pre: 1.3 L/sec
FEF2575-%Pred-Pre: 57 %
FEV1-%Pred-Pre: 49 %
FEV1-Pre: 1.55 L
FEV1FVC-%Pred-Pre: 104 %
FEV6-%Pred-Pre: 50 %
FEV6-Pre: 2.04 L
FEV6FVC-%Pred-Pre: 106 %
FVC-%Pred-Pre: 47 %
FVC-Pre: 2.04 L
Pre FEV1/FVC ratio: 76 %
Pre FEV6/FVC Ratio: 100 %

## 2019-05-21 NOTE — Patient Instructions (Addendum)
Interstitial lung disease due to connective tissue disease ([place]) History of asbestos exposure Chronic respiratory failure with hypoxia ([place])   -Most likely reason for pulmonary fibrosis is the rheumatoid arthritis with other issues such as asbestos, smoking and prior methotrexate may be contributing along with previous occupations   - Disease is better than 2019 on PFT and appears stable on CT Nov 2020  PLAN  - continue albuterol prn    - will do refill for nebs  - continue ofev (glad your are tolerating it well)     - check LFT 05/21/2019 - continue 3-5 LNC o2   Pulmonary venous hypertension  - due to above  - clinically well controlled with lasix  - continue lasix as before  Shortness of breath  - due to all of above + weight + deconditioning - check d-dimer - if abnormal will have you go to ER for CTA - can be at local location  - reason is worse due to other issues other than fibrosis  -refer pulmonary rehab @ [place] or [place] or [place]   Left upper lobe nodule 2.7 cm August 04, 2018 and May 2020 on CT chest and stble Nov 2020  repeat ct chest without contrast  in 6-12 months   Follow-up 3 months - spiro and dlco and reutrn to ILD clinic for 30 min slot Any worsening go to ER

## 2019-05-21 NOTE — Progress Notes (Signed)
HPI   [name] presents for interstitial lung disease evaluation.  He is being taken care of by [name] Dr [name] in [place] but he relocated to [place] and patient is now without [name].  Therefore he is been referred to the ILD center here  IOV 07/22/2018  [place] Integrated Comprehensive ILD Questionnaire  Symptoms: As best as I can gather in 2008 he had [name] with [name] as evidenced by my review of the chart and my visualization of the CT scan.  He does not recollect this much.  He says his been diagnosed with interstitial lung disease starting spring 2019 [prior to that for many years he was just on nighttime oxygen for sleep apnea] and then starting May 2019 he has been on nintedanib.  Sometime around this time he also diagnosed with rheumatoid arthritis.  He says he took [name] of methotrexate but this made his breathing worse.  David AnisErin [name] [facility] is now considering Remicade but this is not been started yet.  He is currently on daily prednisone 5 mg/day but I do not know if this is for lung disease or for rheumatoid arthritis.  At this point in time he is on 3 L of oxygen continuously.  Today when we turned his oxygen off on room air at rest for 20 minutes his pulse ox was 85% on room air with [name] finger and forehead probe.  In terms of his own history he says he has had insidious onset of shortness of breath for the last 14 years.  It is been progressive for the last 1 year.  Present on exertion relieved by rest.  When he takes [name] this is [name].  When he does household work or shopping it is level 3.  When walking up stairs or walking up [name] it is level 1 but this is with oxygen.  He only has cough occasionally.  And occasionally he is wheezy.   Past Medical History : Diagnosis rheumatoid arthritis for few years although at one point in time he told  me it only for the last 1 year.  Denies any other autoimmune disease.  Did not answer anything about diabetes or acid reflux.  Noticed to be on fish oil.  Denied heart disease.  He did see Dr. Gala [name] in cardiology in September 2019.  Echocardiogram shows severe pulmonary artery systolic pressure elevation to 80 mmHg.   ROS: Significant for joint pain for few years.   FAMILY HISTORY of LUNG DISEASE: Denies any family history of pulmonary fibrosis   EXPOSURE HISTORY: Smokes cigarettes since his teenage years.  He states he quit smoking in 2005 smoking 2 to 3 packs/day.  Although today he did smell of tobacco suspect ongoing smoking.  Denies any marijuana or electronic cigarettes or vaping.  No intravenous drug use.   HOME and HOBBY DETAILS : Lives in an apartment for 8 months.  No advance living environment.  No mold or mildew in the house.  No humidifier use.  No CPAP use.  [Is not able to tolerate CPAP/BiPAP] no nebulizer use.  No steam iron.  No Jacuzzi use.  No fountain inside the house.  No pet birds or parakeets.  No hamsters.  No feather pillow use.  No feather blanket use.  No mold [facility] the [facility].  No guarding habits no music habits.   OCCUPATIONAL  HISTORY (122 questions) : He did have asbestos exposure in the past working in [employer].  He did go tobacco as [name].  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  During this time was exposed to dusty environment   PULMONARY TOXICITY HISTORY (27 items): In 2019 for [name] took methotrexate according to his history but this made his dyspnea worse.  Has been on prednisone for 2 years.  Serial pulmonary function testing listed below.    [name], [name] (MRN [id number]) as of 07/22/2018 14:56  Ref. Range 11/01/27 - [place] at time of ofev 03/20/2018  11:28 06/19/18 [place] per notes  FVC-Pre Latest Units: L 1.75L 1.61 1.77L/  FVC-%Pred-Pre Latest Units: %  36 47%  FEV1-Pre Latest Units: L  1.32   FEV1-%Pred-Pre Latest Units: %  40   Pre FEV1/FVC ratio Latest Units: %  82   [name], [name] (MRN [id number]) as of 07/22/2018 14:56  Ref. Range 03/20/2018 11:28  DLCO unc  Latest Units: ml/min/mmHg 6.59  DLCO unc % pred Latest Units: % 19    ECHO 03/20/18 Pulmonary arteries: Systolic pressure was severely increased. PA   peak pressure: 81 mm Hg (S). - Impressions: There is moderate to severe pulmonary HTN with RV   strain.  Impression  - There is moderate to severe pulmonary HTN with RV strain.      OV 08/21/2018  Subjective:  [name], male , DOB: 02/06/1946 , age 73 y.o. , MRN: [id number] , ADDRESS: [address] [place] [id number]   08/21/2018 -   Chief Complaint  Patient presents with  . Follow-up    [name] and is still having problems with breathing when he exerts himself. Pt also has an occ dry cough and has had some mild CP.  DME: [employer], 4 pulse with exertion and 4 continuous at home.     HPI [name] 73 y.o. -presents to ILD clinic for follow-up.  He presents with his wife  The main question is the etiology of his interstitial lung disease.  He has [name] history of rheumatoid arthritis, occupational exposures and age and male gender can put him at risk for UIP/IPF.  In addition in the past he has had methotrexate previous smoker.  Therefore we will subjected him to [name] work-up.  His previous pulmonologist had already started him on nintedanib.  He had [name] high-resolution CT chest that is classic UIP.   The next main question was 1 of treatment.  He is currently on nintedanib.  In the interim his rheumatology PA David Anisrin [name] called me.  She reported the patient has significant pain from rheumatoid arthritis and felt that CellCept or  Imuran would not control the pain.  She is referred Remicade and she would adjust the prednisone.  I supported the plan.  Patient states he only has mild to moderate stiffness at this point of his joints.   The next issue is significant symptomatology of dyspnea.  The dyspnea scale is listed below.  This is quite disabling.  Therefore we did [name] blood gas to look for hypercapnia in case he needed nighttime BiPAP but there is no carbon dioxide retention on the blood  gas below.  In addition I corresponded with Dr. Gala Romney and he did [name] right heart catheterization and there is only mild pulmonary venous hypertension.  Patient has never been to rehabilitation and is willing to go  He has [name] new issue of left upper lobe nodular nodule 2.7 cm.  Radiologist recommending CT chest in 3 months.  New complaint is that the oxygen tubing is causing abrasion of both ear on the pinna bilaterally.  He applies Neosporin sporadically.         IMPRESSION: 1. Spectrum of findings compatible with severe fibrotic interstitial lung disease with advanced honeycombing. Although the distribution is slightly atypical, there are no disqualifying features such as air trapping or nodularity, and the findings are considered most compatible with usual interstitial pneumonia (UIP). Findings are consistent with UIP per consensus guidelines: Diagnosis of Idiopathic Pulmonary Fibrosis: An Official ATS/ERS/JRS/ALAT Clinical Practice Guideline. Am Rosezetta Schlatter Crit Care Med Vol 198, Iss 5, [phone], Mar 02 2017. 2. Mild mediastinal and bilateral hilar adenopathy, nonspecific, most likely reactive. 3. Poorly marginated nodular 2.7 cm focus of consolidation in the left upper lobe is nonspecific, favor focal fibrotic change, cannot exclude neoplasm. Suggest attention on follow-up chest CT in 3 months. 4. Two-vessel coronary atherosclerosis.  Aortic Atherosclerosis (ICD10-I70.0) and Emphysema (ICD10-J43.9).   Electronically  Signed   By: Delbert Phenix M.D.   On: 08/04/2018 10:28   Results for David, [name] (MRN [id number]) as of 08/21/2018 10:44  Ref. Range 08/01/2018 12:44  pH, Arterial Latest Ref Range: 7.350 - 7.450  7.439  pCO2 arterial Latest Ref Range: 32.0 - 48.0 mmHg 42.6  pO2, Arterial Latest Ref Range: 83.0 - 108.0 mmHg 50.8 (L)    Right heart cath 08/08/2018 Findings:  RA = 7 RV = 49/8 PA = 52/17 (31) PCW = 22 Fick cardiac output/index = 5.5/2.5 PVR = 1.9 WU Ao sat = 94% PA sat = 67%, 67%  Assessment:  1. Mild pulmonary venous HTN  Plan/Discussion:  PA pressures only mildly elevated and mostly due to pulmonary venous HTN. Not candidate for selective pulmonary artery vasodilators.   Arvilla Meres, MD   ROS - per HPI   OV 03/04/2019  Subjective:  [name], male , DOB: 04-Feb-1946 , age 39 y.o. , MRN: [id number] , ADDRESS: [address] [place] [address]   03/04/2019 -   Chief Complaint  Patient presents with  . Interstitial lung disease due to connective tissue disease    Feels like his breathing is worse since last visit. Gets short of breath after walking 30 feet or less.   Follow-up progressive interstitial lung disease UIP pattern secondary to rheumatoid arthritis and also in the setting of previous asbestos exposure and methotrexate therapy.  On nintedanib. On Remicade for rheumatoid arthritis  Follow-up left upper lobe 2.7 cm nodule February 2020  -unchanged May 2020.  HPI JAIREN GOLDFARB 73 y.o. -presents with his wife for the above problems.  He has progressive worsening of dyspnea as documented below on his dyspnea score.  He is using 5 L of oxygen at rest.  He uses [name] 3 L portable oxygen but states that he has class III dyspnea that is severe.  He feels he might be desaturating although he has not tested himself for that.  He is in need of [name] flu shot.  He has been taking nintedanib for several months.  He says he is not had any  single side effect from it.  He says he is compliant with it.  For his rheumatoid arthritis his rheumatologist now has him on Remicade and his wife and he confirmed that he is taking and getting infusions on [name] regular basis.  He is really concerned about his progressive dyspnea.  And [name] CT scan of the chest in May 2020 for the nodule regression of fibrosis was not reported.  His echocardiogram in 2019 shows good ejection fraction although he has diastolic dysfunction.   Walked him 185 feet x 3 laps on 3L [place] portable - did only 1 lap and desaturated to 83%. Needed 5L [place] to correct       CT chest May 2020  IMPRESSION: 1. Stable 2.7 cm ill-defined nodular focus of consolidation in left upper lobe, with associated bronchiectatic air bronchograms. This likely represents an area of confluent fibrosis, with neoplasm considered much less likely. Continued followup by chest CT without contrast recommended in 6 months. 2. Stable mild mediastinal and bilateral hilar lymphadenopathy, likely reactive in etiology. 3. Stable chronic pulmonary interstitial fibrosis with advanced honeycombing.  Aortic Atherosclerosis (ICD10-I70.0). Coronary artery atherosclerosis.   Electronically Signed   By: Myles Rosenthal M.D.   On: 11/19/2018 13:44  OV 05/21/2019  Subjective:  [name], male , DOB: [birth year] , age 14 y.o. , MRN: [id number] , ADDRESS: [address] [place] [address]  Follow-up progressive interstitial lung disease UIP pattern secondary to rheumatoid arthritis and also in the setting of previous asbestos exposure and methotrexate therapy.  On nintedanib. On Remicade for rheumatoid arthritis  Pulmonary vernous hypertension feb 2020  -  Right heart cath 08/08/2018 Findings: PA = 52/17 (31) PCW = 22 Fick cardiac output/index = 5.5/2.5 PVR = 1.9 WU    Follow-up left upper lobe 2.7 cm nodule February 2020  -unchanged May 2020.and Nov 2020    05/21/2019 -    Chief Complaint  Patient presents with  . Follow-up    PFT performed today. Pt states he has not been doing well since last visit. States his breathing has become worse. Pt is on 3L O2 at rest and 5L with exertion. Pt denies any complaints of cough or chest tightness.     HPI [name] 73 y.o. -last seen September 2020 for the above issues.  Presents with his wife.  He lives in [place].  After the visit in September 2020 he saw his rheumatology PA [name].  She is worried about worsening interstitial lung disease because of worsening shortness of breath.  He and his wife tell me that his shortness of breath is significantly worsened.  This is reflected in the symptom scores below.  However his oxygen need appears to be the same.  He had pulmonary function test because of the concern of worsening ILD.  The pulmonary function test is actually better than [name] year ago.  He had [name] high-resolution CT chest 3 days ago.  I personally visualized the CT.  The report is below.  There is no comment about progression from Dr. Leanna Battles but in my personal visualization I do not think there is any progression.  I have written to Dr. Fredirick Lathe to make [name] comment about progression or not.  Nevertheless he is having progressive shortness of breath.  He is sedentary.  He is overweight.  They are not been able to attend rehab because of the pandemic.  He is asking for albuterol nebulizer refill.     SYMPTOM SCALE - ILD 08/21/2018  03/04/2019  05/21/2019   O2 use 4LNC at rest 3 L [place] portable + 5L [place] at night rest 3 to 5  Shortness of Breath 0 -> 5 scale with 5 being worst (score 6 If unable to do)    At rest 1 0 2  Simple tasks - showers, clothes change, eating, shaving 4 5 5   Household (dishes, doing bed, laundry) 3 5 5   Shopping 3 5 6   Walking level at own pace 3 5 6   Walking keeping up with others of same age 4 5 6   Walking up Stairs 3 5 6   Walking up [place] 3 5 6   Total (40 - 48) Dyspnea Score 24  35 42  How bad is your cough? x    How bad is your fatigue x      PFT  Results for David, [name] (MRN [id number]) as of 05/21/2019 09:51  Ref. Range 03/20/2018 11:28 05/21/2019 08:54  FVC-Pre Latest Units: L 1.61 2.04  FVC-%Pred-Pre Latest Units: % 36 47  Results for KUPER, RENNELS (MRN [id number]) as of 05/21/2019 09:51  Ref. Range 03/20/2018 11:28 05/21/2019 08:54  DLCO unc Latest Units: ml/min/mmHg 6.59 11.64  DLCO unc % pred Latest Units: % 19 45     Ct Chest High Resolution  Result Date: 05/18/2019 CLINICAL DATA:  Interstitial lung disease. Chronic oxygen therapy, extreme shortness of breath. EXAM: CT CHEST WITHOUT CONTRAST TECHNIQUE: Multidetector CT imaging of the chest was performed following the standard protocol without intravenous contrast. High resolution imaging of the lungs, as well as inspiratory and expiratory imaging, was performed. COMPARISON:  11/18/2018, 08/01/2018, 10/14/2006. FINDINGS: Cardiovascular: Atherosclerotic calcification of the aorta and coronary arteries. Pulmonic trunk is enlarged. Heart size normal. No pericardial effusion. Mediastinum/Nodes: Mediastinal lymph nodes appear minimally less prominent. Index right paratracheal lymph node measures 10 mm, compared to 12 mm on 11/18/2018. Hilar regions are difficult to definitively evaluate without IV contrast. No axillary adenopathy. Esophagus is grossly unremarkable. Lungs/Pleura: Extensive cystic destruction of the lungs bilaterally, upper/midlung zone predominant. Superimposed patchy nodular consolidation in the left upper lobe is unchanged. Scattered subpleural reticular and ground-glass densities with traction bronchiolectasis, primarily in the lower lobes. Scattered pleural calcifications. No pleural fluid. No air trapping. Airway is unremarkable. Upper Abdomen: Visualized portions of the liver, gallbladder, adrenal glands, spleen, pancreas, stomach and bowel are grossly unremarkable. Musculoskeletal:  Degenerative changes in the spine. No worrisome lytic or sclerotic lesions. IMPRESSION: 1. Extensive upper/midlung zone predominant cystic lung destruction/honeycombing with mild basilar subpleural reticular densities and traction bronchiolectasis. Scattered pleural calcifications and known asbestos exposure raise concern for asbestosis. Lack of craniocaudal gradient is considered nontypical for usual interstitial pneumonitis. Findings are indeterminate for UIP per consensus guidelines: Diagnosis of Idiopathic Pulmonary Fibrosis: An Official ATS/ERS/JRS/ALAT Clinical Practice Guideline. [place], Iss 5, [phone], Mar 02 2017. 2. Stable irregular nodular consolidation in the left upper lobe. Additional follow-up could be obtained in 6-12 months, as clinically indicated. 3. Aortic atherosclerosis (ICD10-170.0). Coronary artery calcification. 4. Enlarged pulmonic trunk, indicative of pulmonary arterial hypertension. Electronically Signed   By: Lorin Picket M.D.   On: 05/18/2019 16:46    ROS - per HPI     has [name] past medical history of Cataracts, bilateral, COPD (chronic obstructive pulmonary disease) ([place]), Hearing loss, Macular degeneration, and Rheumatoid arthritis ([place]).   reports that he quit smoking about 15 years ago. His smoking use included cigarettes. He has [name] 75.00 pack-year smoking history. He has never  used smokeless tobacco.  Past Surgical History:  Procedure Laterality Date  . LUNG BIOPSY    . RIGHT HEART CATH N/[name] 08/08/2018   Procedure: RIGHT HEART CATH;  Surgeon: Dolores Patty, MD;  Location: [facility] IN[facility]ASIVE CV LAB;  Service: Cardiovascular;  Laterality: N/[name];    No Known Allergies  Immunization History  Administered Date(s) Administered  . Fluad Quad(high Dose 65+) 03/04/2019  . Influenza, High Dose Seasonal PF 08/21/2018  . Pneumococcal Conjugate-13 02/20/2013  . Pneumococcal Polysaccharide-23 03/26/2014    No family history on file.   Current  Outpatient Medications:  .  Artificial Tear Ointment (LUBRICANT EYE OP), Place 1-2 drops into both eyes daily. Gel Drops, Disp: , Rfl:  .  furosemide (LASIX) 20 MG tablet, Take 20 mg by mouth daily as needed for edema. , Disp: , Rfl: 1 .  ibuprofen (ADVIL,MOTRIN) 200 MG tablet, Take 600 mg by mouth every 6 (six) hours as needed., Disp: , Rfl:  .  mirtazapine (REMERON) 30 MG tablet, Take 30 mg by mouth at bedtime. , Disp: , Rfl:  .  mometasone-formoterol (DULERA) 100-5 MCG/ACT AERO, Inhale 2 puffs into the lungs 2 (two) times daily., Disp: , Rfl:  .  Multiple Vitamins-Minerals (PRESERVISION AREDS 2+MULTI VIT) CAPS, Take 1 capsule by mouth 2 (two) times daily., Disp: , Rfl:  .  Nintedanib (OFEV) 150 MG CAPS, Take 150 mg by mouth 2 (two) times daily., Disp: , Rfl:  .  Omega-3 Fatty Acids (FISH OIL) 1200 MG CAPS, Take 1,200 mg by mouth 2 (two) times daily. , Disp: , Rfl:  .  predniSONE (DELTASONE) 10 MG tablet, Take 10 mg by mouth 2 (two) times daily with [name] meal. , Disp: , Rfl:  .  Turmeric 500 MG CAPS, Take 500 mg by mouth daily. , Disp: , Rfl:       Objective:   Vitals:   05/21/19 0955  BP: 126/74  Pulse: (!) 108  SpO2: (!) 88%  Weight: 235 lb (106.6 kg)  Height: 5' 10.5" (1.791 m)    Estimated body mass index is 33.24 kg/m as calculated from the following:   Height as of this encounter: 5' 10.5" (1.791 m).   Weight as of this encounter: 235 lb (106.6 kg).  @  [employer]   05/21/19 0955  Weight: 235 lb (106.6 kg)     Physical Exam  General Appearance:    Alert, cooperative, no distress, appears stated age - yes , Deconditioned looking - yes , OBESE  - yes, Sitting on Wheelchair -  no  Head:    Normocephalic, without obvious abnormality, atraumatic  Eyes:    PERRL, conjunctiva/corneas clear,  Ears:    Normal TM's and external ear canals, both ears  Nose:   Nares normal, septum midline, mucosa normal, no drainage    or sinus tenderness. OXYGEN ON  - yes .  Patient is @ 3L   Throat:   Lips, mucosa, and tongue normal; teeth and gums normal. Cyanosis on lips - no  Neck:   Supple, symmetrical, trachea midline, no adenopathy;    thyroid:  no enlargement/tenderness/nodules; no carotid   bruit or JVD  Back:     Symmetric, no curvature, ROM normal, no CVA tenderness  Lungs:     Distress - no , Wheeze no, Barrell Chest - no, Purse lip breathing - no, Crackles - YES  Chest Wall:    No tenderness or deformity.    Heart:    Regular rate and rhythm,  S1 and S2 normal, no rub   or gallop, Murmur - no  Breast Exam:    NOT DONE  Abdomen:     Soft, non-tender, bowel sounds active all four quadrants,    no masses, no organomegaly. Visceral obesity - yes  Genitalia:   NOT DONE  Rectal:   NOT DONE  Extremities:   Extremities - normal, Has Cane - no, Clubbing - no, Edema - no  Pulses:   2+ and symmetric all extremities  Skin:   Stigmata of Connective Tissue Disease - RA  Lymph nodes:   Cervical, supraclavicular, and axillary nodes normal  Psychiatric:  Neurologic:   Pleasant - yes, Anxious - no, Flat affect - no  CAm-ICU - neg, Alert and Oriented x 3 - yes, Moves all 4s - yes, Speech - normal, Cognition - intact           Assessment:       ICD-10-CM   1. Interstitial lung disease due to connective tissue disease (HCC)  J84.89    M35.9   2. History of asbestos exposure  Z77.090   3. Chronic respiratory failure with hypoxia (HCC)  J96.11   4. Other secondary pulmonary hypertension (HCC)  I27.29   5. Solitary pulmonary nodule  R91.1        Plan:     Patient Instructions  Interstitial lung disease due to connective tissue disease (HCC) History of asbestos exposure Chronic respiratory failure with hypoxia (HCC)   -Most likely reason for pulmonary fibrosis is the rheumatoid arthritis with other issues such as asbestos, smoking and prior methotrexate may be contributing along with previous occupations   - Disease is better than 2019 on PFT and  appears stable on CT Nov 2020  PLAN  - continue albuterol prn    - will do refill for nebs  - continue ofev (glad your are tolerating it well)     - check LFT 05/21/2019 - continue 3-5 LNC o2   Pulmonary venous hypertension  - due to above  - clinically well controlled with lasix  - continue lasix as before  Shortness of breath  - due to all of above + weight + deconditioning  - reason is worse due to other issues other than fibrosis  -refer pulmonary rehab @ [place] or [place] or [place]   Left upper lobe nodule 2.7 cm August 04, 2018 and May 2020 on CT chest and stble Nov 2020  repeat ct chest without contrast  in 6-12 months   Follow-up 3 months - spiro and dlco and reutrn to ILD clinic for 30 min slot Any worsening go to ER     > 50% of this > 40 min visit spent in face to face counseling or/and coordination of care - by this undersigned MD - Dr Kalman ShanMurali Lindaann Gradilla. This includes one or more of the following documented above: discussion of test results, diagnostic or treatment recommendations, prognosis, risks and benefits of management options, instructions, education, compliance or risk-factor reduction  SIGNATURE    Dr. Kalman ShanMurali Taytum Scheck, M.D., F.C.C.P,  Pulmonary and Critical Care Medicine Staff Physician, [facility] Director - Interstitial Lung Disease  Program  Pulmonary Fibrosis [place] [facility] [place], [place], [id number]  Pager: [phone], If no answer or between  15:00h - 7:00h: call [phone] Telephone: [phone]  10:21 AM 05/21/2019

## 2019-05-21 NOTE — Progress Notes (Signed)
Full PFT performed today. °

## 2019-05-25 DIAGNOSIS — M0589 Other rheumatoid arthritis with rheumatoid factor of multiple sites: Secondary | ICD-10-CM | POA: Diagnosis not present

## 2019-05-26 ENCOUNTER — Other Ambulatory Visit: Payer: Self-pay | Admitting: Internal Medicine

## 2019-05-26 MED ORDER — ALBUTEROL SULFATE (2.5 MG/3ML) 0.083% IN NEBU: 2.5000 mg | INHALATION_SOLUTION | Freq: Four times a day (QID) | RESPIRATORY_TRACT | 3 refills | Status: AC | PRN

## 2019-05-26 NOTE — Progress Notes (Signed)
Spoke with pt and notified of results per Dr. Ramaswamy. Pt verbalized understanding and denied any questions.  

## 2019-06-05 DIAGNOSIS — H353133 Nonexudative age-related macular degeneration, bilateral, advanced atrophic without subfoveal involvement: Secondary | ICD-10-CM | POA: Diagnosis not present

## 2019-07-16 ENCOUNTER — Telehealth: Payer: Self-pay | Admitting: Internal Medicine

## 2019-07-16 NOTE — Telephone Encounter (Signed)
Spoke with pt's wife, Thelma Barge. They are wanting to know if MR recommends him getting the COVID vaccine. Advised him that we are recommending that our pts get the vaccine. Nothing further was needed.

## 2019-07-20 DIAGNOSIS — M0589 Other rheumatoid arthritis with rheumatoid factor of multiple sites: Secondary | ICD-10-CM | POA: Diagnosis not present

## 2019-07-22 DIAGNOSIS — E782 Mixed hyperlipidemia: Secondary | ICD-10-CM | POA: Diagnosis not present

## 2019-07-22 DIAGNOSIS — R110 Nausea: Secondary | ICD-10-CM | POA: Diagnosis not present

## 2019-07-22 DIAGNOSIS — R739 Hyperglycemia, unspecified: Secondary | ICD-10-CM | POA: Diagnosis not present

## 2019-07-22 DIAGNOSIS — I10 Essential (primary) hypertension: Secondary | ICD-10-CM | POA: Diagnosis not present

## 2019-07-22 DIAGNOSIS — J449 Chronic obstructive pulmonary disease, unspecified: Secondary | ICD-10-CM | POA: Diagnosis not present

## 2019-08-11 DIAGNOSIS — H353134 Nonexudative age-related macular degeneration, bilateral, advanced atrophic with subfoveal involvement: Secondary | ICD-10-CM | POA: Diagnosis not present

## 2019-08-12 ENCOUNTER — Encounter (HOSPITAL_COMMUNITY): Payer: Medicare Other

## 2019-08-18 ENCOUNTER — Telehealth: Payer: Self-pay | Admitting: Internal Medicine

## 2019-08-18 NOTE — Telephone Encounter (Signed)
Email from Dr Fredirick Lathe radiologist in NOv 2020 asking about progression of ILD on CT chest. Per her, "Hi Tabius Rood, I apologize for not commenting on presence/absence of progression. Lungs are stable from Jan 2020 (though Nov 2020) but markedly progressive from 2008.   [name]

## 2019-08-19 ENCOUNTER — Other Ambulatory Visit: Payer: Self-pay

## 2019-08-19 ENCOUNTER — Encounter (HOSPITAL_COMMUNITY)
Admission: RE | Admit: 2019-08-19 | Discharge: 2019-08-19 | Disposition: A | Discharge status: Home / Self Care | Payer: Medicare Other | Source: Ambulatory Visit | Attending: Internal Medicine | Admitting: Internal Medicine

## 2019-08-21 ENCOUNTER — Other Ambulatory Visit (HOSPITAL_COMMUNITY)
Admission: RE | Admit: 2019-08-21 | Discharge: 2019-08-21 | Disposition: A | Discharge status: Home / Self Care | Payer: Medicare Other | Source: Ambulatory Visit | Attending: Internal Medicine | Admitting: Internal Medicine

## 2019-08-21 ENCOUNTER — Telehealth: Payer: Self-pay | Admitting: Internal Medicine

## 2019-08-21 ENCOUNTER — Other Ambulatory Visit: Payer: Self-pay

## 2019-08-21 DIAGNOSIS — Z20822 Contact with and (suspected) exposure to covid-19: Secondary | ICD-10-CM | POA: Diagnosis not present

## 2019-08-21 DIAGNOSIS — Z01812 Encounter for preprocedural laboratory examination: Secondary | ICD-10-CM | POA: Diagnosis not present

## 2019-08-21 LAB — SARS CORONAVIRUS 2 (TAT 6-24 HRS): SARS Coronavirus 2: NEGATIVE

## 2019-08-21 NOTE — Telephone Encounter (Signed)
Outside labs from July 23, 2019 reviewed.  Creatinine 0.8 mg percent.  Albumin 4.4.  Liver function test is normal.  Hemoglobin 15.3 g% and platelets 235 with a white count of 11.1.  All normal

## 2019-08-25 ENCOUNTER — Ambulatory Visit: Payer: Medicare Other

## 2019-08-25 ENCOUNTER — Ambulatory Visit: Payer: Medicare Other | Admitting: Primary Care

## 2019-08-25 ENCOUNTER — Other Ambulatory Visit: Payer: Self-pay

## 2019-08-25 ENCOUNTER — Ambulatory Visit: Payer: Medicare Other | Admitting: Internal Medicine

## 2019-08-25 DIAGNOSIS — J8489 Other specified interstitial pulmonary diseases: Secondary | ICD-10-CM

## 2019-08-25 LAB — PULMONARY FUNCTION TEST
DL/VA % pred: 48 %
DL/VA: 1.94 ml/min/mmHg/L
DLCO unc % pred: 25 %
DLCO unc: 6.53 ml/min/mmHg
FEF 25-75 Pre: 1.94 L/sec
FEF2575-%Pred-Pre: 85 %
FEV1-%Pred-Pre: 44 %
FEV1-Pre: 1.38 L
FEV1FVC-%Pred-Pre: 121 %
FEV6-%Pred-Pre: 38 %
FEV6-Pre: 1.57 L
FEV6FVC-%Pred-Pre: 106 %
FVC-%Pred-Pre: 36 %
FVC-Pre: 1.57 L
Pre FEV1/FVC ratio: 88 %
Pre FEV6/FVC Ratio: 100 %

## 2019-09-03 ENCOUNTER — Ambulatory Visit: Payer: Medicare Other | Admitting: Internal Medicine

## 2019-09-14 ENCOUNTER — Telehealth: Payer: Self-pay | Admitting: Internal Medicine

## 2019-09-14 NOTE — Telephone Encounter (Signed)
Called and spoke with [name] at [facility], states that pt arrived to their clinic for infusion and was wearing 2lpm cont O2 and sats were at 62%.  Pt states that he typically wears 5lpm at home.  Per [name], pt was transferred to wheelchair and O2 increased to 3lpm.  O2 had risen to upper 70's% while on 3lpm at rest at their office, and [name] stated that the patient looked visibly in distress.  I advised [name] that if his sats were still that low his O2 needed to be increased to maintain proper O2 sats and if his O2 was that low and he was in distress he needed to present to the ED.  [name] states that the pt had already left their facility (wife drove), and they had relayed from my message copied below that he needed to present to ED.  Pt stated to [name] that he would start heading home and if he didn't feel any better that he would go to ED as recommended.  Called and spoke with pt's wife (pt didn't have hearing aids in), and reiterated that pt needed to present to ED since his sats are that low and his he is so sob.  Pt was audibly SOB on phone while I was speaking to him and his wife on speakerphone.  Wife expressed understanding of our recommendations.   Pt has appt 09/16/2019, will keep this appt.   Forwarding to MR as FYI.

## 2019-09-14 NOTE — Telephone Encounter (Signed)
[name] at [facility] said that couldn't make the patient go to the ED if patient declined.  Would let pt know to go the ED.  Office stated for pt to keep appt Wednesday.

## 2019-09-14 NOTE — Telephone Encounter (Signed)
Conversation sent to triage [2:02 PM] Limmie Patricia ([facility])Important!     I have [name] with [facility] that has [name], mrn [phone] in the office with oxygen saturation at 62% on 5 liters of oxygen.  Was supposed to have infusion.   Can he be seen today at 3:30 with [place] or can someone speak to [place]? ?[2:02 PM] Limmie Patricia ([facility])Important!     He has an appt 3/17 with Dr. Marchelle Gearing.  Office requesting today or tomorrow.  Wasn't sure if a phone visit would be sufficient.  ?[2:03 PM] Dorethea Clan     I"m finishing up a call, but if his O2 is that low he needs to go to the ED ?[2:04 PM] Limmie Patricia ([facility])     I told her, but they aren't going to make the patient go if he declines.   ?[2:05 PM] Limmie Patricia ([facility])     Will still keep appointment for Wednesday she said.  [name] verified w/provider. ?[2:07 PM] Dorethea Clan     fair enough.  thank you!

## 2019-09-16 ENCOUNTER — Encounter: Payer: Medicare Other | Admitting: Internal Medicine

## 2019-09-16 ENCOUNTER — Encounter: Payer: Self-pay | Admitting: Internal Medicine

## 2019-09-16 ENCOUNTER — Telehealth: Payer: Self-pay | Admitting: Internal Medicine

## 2019-09-16 ENCOUNTER — Other Ambulatory Visit: Payer: Self-pay

## 2019-09-16 ENCOUNTER — Ambulatory Visit (INDEPENDENT_AMBULATORY_CARE_PROVIDER_SITE_OTHER): Payer: Medicare Other | Admitting: Internal Medicine

## 2019-09-16 VITALS — BP 128/80 | HR 103 | Temp 97.4°F | Ht 71.0 in | Wt 216.0 lb

## 2019-09-16 DIAGNOSIS — J849 Interstitial pulmonary disease, unspecified: Secondary | ICD-10-CM

## 2019-09-16 DIAGNOSIS — J9611 Chronic respiratory failure with hypoxia: Secondary | ICD-10-CM

## 2019-09-16 DIAGNOSIS — Z77090 Contact with and (suspected) exposure to asbestos: Secondary | ICD-10-CM | POA: Diagnosis not present

## 2019-09-16 DIAGNOSIS — R911 Solitary pulmonary nodule: Secondary | ICD-10-CM

## 2019-09-16 DIAGNOSIS — J8489 Other specified interstitial pulmonary diseases: Secondary | ICD-10-CM | POA: Diagnosis not present

## 2019-09-16 DIAGNOSIS — R0602 Shortness of breath: Secondary | ICD-10-CM

## 2019-09-16 DIAGNOSIS — M359 Systemic involvement of connective tissue, unspecified: Secondary | ICD-10-CM

## 2019-09-16 DIAGNOSIS — Z5181 Encounter for therapeutic drug level monitoring: Secondary | ICD-10-CM | POA: Diagnosis not present

## 2019-09-16 LAB — HEPATIC FUNCTION PANEL
ALT: 68 U/L — ABNORMAL HIGH (ref 0–53)
AST: 75 U/L — ABNORMAL HIGH (ref 0–37)
Albumin: 3.8 g/dL (ref 3.5–5.2)
Alkaline Phosphatase: 86 U/L (ref 39–117)
Bilirubin, Direct: 0.1 mg/dL (ref 0.0–0.3)
Total Bilirubin: 0.4 mg/dL (ref 0.2–1.2)
Total Protein: 8.1 g/dL (ref 6.0–8.3)

## 2019-09-16 NOTE — Telephone Encounter (Signed)
Pt has arrived for OV for today at 0930 with MR. Will forward as FYI.

## 2019-09-16 NOTE — Progress Notes (Signed)
[name] presents for interstitial lung disease evaluation.  He is being taken care of by a pulmonologist Dr Judith Part in [place], [place] but he relocated to [place] and patient is now without a pulmonologist.  Therefore he is been referred to the ILD center here  IOV 07/22/2018  [facility] Comprehensive ILD Questionnaire  Symptoms: As best as I can gather in 2008 he had a right necrotizing pneumonia with a chest tube as evidenced by my review of the chart and my visualization of the CT scan.  He does not recollect this much.  He says his been diagnosed with interstitial lung disease starting spring 2019 [prior to that for many years he was just on nighttime oxygen for sleep apnea] and then starting May 2019 he has been on nintedanib.  Sometime around this time he also diagnosed with rheumatoid arthritis.  He says he took a few days of methotrexate but this made his breathing worse.  [name] the nurse practitioner at [facility] rheumatology is now considering Remicade but this is not been started yet.  He is currently on daily prednisone 5 mg/day but I do not know if this is for lung disease or for rheumatoid arthritis.  At this point in time he is on 3 L of oxygen continuously.  Today when we turned his oxygen off on room air at rest for 20 minutes his pulse ox was 85% on room air with a finger and forehead probe.  In terms of his own history he says he has had insidious onset of shortness of breath for the last 14 years.  It is been progressive for the last 1 year.  Present on exertion relieved by rest.  When he takes a shower or dress this is a level 2 dyspnea.  When he does household work or shopping it is level 3.  When walking up stairs or walking up a hill it is level 1 but this is with oxygen.  He only has cough occasionally.  And occasionally he is wheezy.   Past Medical History : Diagnosis rheumatoid arthritis for few years although at one point in time he told me  it only for the last 1 year.  Denies any other autoimmune disease.  Did not answer anything about diabetes or acid reflux.  Noticed to be on fish oil.  Denied heart disease.  He did see Dr. Gala [name] in cardiology in September 2019.  Echocardiogram shows severe pulmonary artery systolic pressure elevation to 80 mmHg.   ROS: Significant for joint pain for few years.   FAMILY HISTORY of LUNG DISEASE: Denies any family history of pulmonary fibrosis   EXPOSURE HISTORY: Smokes cigarettes since his teenage years.  He states he quit smoking in 2005 smoking 2 to 3 packs/day.  Although today he did smell of tobacco suspect ongoing smoking.  Denies any marijuana or electronic cigarettes or vaping.  No intravenous drug use.   HOME and HOBBY DETAILS : Lives in an apartment for 8 months.  No advance living environment.  No mold or mildew in the house.  No humidifier use.  No CPAP use.  [Is not able to tolerate CPAP/BiPAP] no nebulizer use.  No steam iron.  No Jacuzzi use.  No fountain inside the house.  No pet birds or parakeets.  No hamsters.  No feather pillow use.  No feather blanket use.  No mold in the [facility] duct.  No guarding habits no music habits.  OCCUPATIONAL HISTORY (122 questions) : He did have asbestos exposure in the past working in [employer].  He did go tobacco as a young adult.  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  During this time was exposed to dusty environment   PULMONARY TOXICITY HISTORY (27 items): In 2019 for a few days took methotrexate according to his history but this made his dyspnea worse.  Has been on prednisone for 2 years.  Serial pulmonary function testing listed below.    Results for [name], PARA (MRN [id number]) as of 07/22/2018 14:56  Ref. Range 11/01/27 - [place] at time of ofev 03/20/2018  11:28 06/19/18 [place] per notes  FVC-Pre Latest Units: L 1.75L 1.61 1.77L/  FVC-%Pred-Pre Latest Units: %  36 47%  FEV1-Pre Latest Units: L  1.32   FEV1-%Pred-Pre Latest Units: %  40   Pre FEV1/FVC ratio Latest Units: %  82   Results for [name], [name] (MRN [id number]) as of 07/22/2018 14:56  Ref. Range 03/20/2018 11:28  DLCO unc  Latest Units: ml/min/mmHg 6.59  DLCO unc % pred Latest Units: % 19    ECHO 03/20/18 Pulmonary arteries: Systolic pressure was severely increased. PA   peak pressure: 81 mm Hg (S). - Impressions: There is moderate to severe pulmonary HTN with RV   strain.  Impression  - There is moderate to severe pulmonary HTN with RV strain.      OV 08/21/2018  Subjective:  [name], male , DOB: [birth year] , age 74 y.o. , MRN: [id number] , ADDRESS: [address] [place] [address]   08/21/2018 -   Chief Complaint  Patient presents with  . Follow-up    [name] and is still having problems with breathing when he exerts himself. Pt also has an occ dry cough and has had some mild CP.  DME: [employer], 4 pulse with exertion and 4 continuous at home.     HPI CODA [name] 74 y.o. -presents to ILD clinic for follow-up.  He presents with his wife  The main question is the etiology of his interstitial lung disease.  He has a history of rheumatoid arthritis, occupational exposures and age and male gender can put him at risk for UIP/IPF.  In addition in the past he has had methotrexate previous smoker.  Therefore we will subjected him to a work-up.  His previous pulmonologist had already started him on nintedanib.  He had a high-resolution CT chest that is classic UIP.   The next main question was 1 of treatment.  He is currently on nintedanib.  In the interim his rheumatology PA [name] called me.  She reported the patient has significant pain from rheumatoid arthritis and felt that CellCept or  Imuran would not control the pain.  She is referred Remicade and she would adjust the prednisone.  I supported the plan.  Patient states he only has mild to moderate stiffness at this point of his joints.   The next issue is significant symptomatology of dyspnea.  The dyspnea scale is listed below.  This is quite disabling.  Therefore we did a blood gas to look for hypercapnia in case he needed nighttime BiPAP but there is no carbon dioxide retention on the  blood gas below.  In addition I corresponded with Dr. Gala [name] and he did a right heart catheterization and there is only mild pulmonary venous hypertension.  Patient has never been to rehabilitation and is willing to go  He has a new issue of left upper lobe nodular nodule 2.7 cm.  Radiologist recommending CT chest in 3 months.  New complaint is that the oxygen tubing is causing abrasion of both ear on the pinna bilaterally.  He applies Neosporin sporadically.         IMPRESSION: 1. Spectrum of findings compatible with severe fibrotic interstitial lung disease with advanced honeycombing. Although the distribution is slightly atypical, there are no disqualifying features such as air trapping or nodularity, and the findings are considered most compatible with usual interstitial pneumonia (UIP). Findings are consistent with UIP per consensus guidelines: Diagnosis of Idiopathic Pulmonary Fibrosis: An Official ATS/ERS/JRS/ALAT Clinical Practice Guideline. Am Rosezetta Schlatter Crit Care Med Vol 198, Iss 5, [phone], Mar 02 2017. 2. Mild mediastinal and bilateral hilar adenopathy, nonspecific, most likely reactive. 3. Poorly marginated nodular 2.7 cm focus of consolidation in the left upper lobe is nonspecific, favor focal fibrotic change, cannot exclude neoplasm. Suggest attention on follow-up chest CT in 3 months. 4. Two-vessel coronary atherosclerosis.  Aortic Atherosclerosis (ICD10-I70.0) and Emphysema (ICD10-J43.9).   Electronically  Signed   By: Delbert Phenix M.D.   On: 08/04/2018 10:28   Results for ZAREK, DACKO (MRN [id number]) as of 08/21/2018 10:44  Ref. Range 08/01/2018 12:44  pH, Arterial Latest Ref Range: 7.350 - 7.450  7.439  pCO2 arterial Latest Ref Range: 32.0 - 48.0 mmHg 42.6  pO2, Arterial Latest Ref Range: 83.0 - 108.0 mmHg 50.8 (L)    Right heart cath 08/08/2018 Findings:  RA = 7 RV = 49/8 PA = 52/17 (31) PCW = 22 Fick cardiac output/index = 5.5/2.5 PVR = 1.9 WU Ao sat = 94% PA sat = 67%, 67%  Assessment:  1. Mild pulmonary venous HTN  Plan/Discussion:  PA pressures only mildly elevated and mostly due to pulmonary venous HTN. Not candidate for selective pulmonary artery vasodilators.   Arvilla Meres, MD   ROS - per HPI   OV 03/04/2019  Subjective:  Patient ID: JULYAN MCKEAG, male , DOB: May 28, 1946 , age 4 y.o. , MRN: [id number] , ADDRESS: [address] [place] [address]   03/04/2019 -   Chief Complaint  Patient presents with  . Interstitial lung disease due to connective tissue disease    Feels like his breathing is worse since last visit. Gets short of breath after walking 30 feet or less.   Follow-up progressive interstitial lung disease UIP pattern secondary to rheumatoid arthritis and also in the setting of previous asbestos exposure and methotrexate therapy.  On nintedanib. On Remicade for rheumatoid arthritis  Follow-up left upper lobe 2.7 cm nodule February 2020  -unchanged May 2020.  HPI ZAITH HURD 74 y.o. -presents with his wife for the above problems.  He has progressive worsening of dyspnea as documented below on his dyspnea score.  He is using 5 L of oxygen at rest.  He uses a 3 L portable oxygen but states that he has class III dyspnea that is severe.  He feels he might be desaturating although he has not tested himself for that.  He is in need of a flu shot.  He has been taking nintedanib for several months.  He says he is not had any  single side effect from  it.  He says he is compliant with it.  For his rheumatoid arthritis his rheumatologist now has him on Remicade and his wife and he confirmed that he is taking and getting infusions on a regular basis.  He is really concerned about his progressive dyspnea.  And a CT scan of the chest in May 2020 for the nodule regression of fibrosis was not reported.  His echocardiogram in 2019 shows good ejection fraction although he has diastolic dysfunction.   Walked him 185 feet x 3 laps on 3L [place] portable - did only 1 lap and desaturated to 83%. Needed 5L [place] to correct       CT chest May 2020  IMPRESSION: 1. Stable 2.7 cm ill-defined nodular focus of consolidation in left upper lobe, with associated bronchiectatic air bronchograms. This likely represents an area of confluent fibrosis, with neoplasm considered much less likely. Continued followup by chest CT without contrast recommended in 6 months. 2. Stable mild mediastinal and bilateral hilar lymphadenopathy, likely reactive in etiology. 3. Stable chronic pulmonary interstitial fibrosis with advanced honeycombing.  Aortic Atherosclerosis (ICD10-I70.0). Coronary artery atherosclerosis.   Electronically Signed   By: Earle Gell M.D.   On: 11/19/2018 13:44  OV 05/21/2019  Subjective:  [name], male , DOB: 10/23/1945 , age 56 y.o. , MRN: [id number] , ADDRESS: [address] [place] [address]  Follow-up progressive interstitial lung disease UIP pattern secondary to rheumatoid arthritis and also in the setting of previous asbestos exposure and methotrexate therapy.  On nintedanib. On Remicade for rheumatoid arthritis  Pulmonary vernous hypertension feb 2020  -  Right heart cath 08/08/2018 Findings: PA = 52/17 (31) PCW = 22 Fick cardiac output/index = 5.5/2.5 PVR = 1.9 WU    Follow-up left upper lobe 2.7 cm nodule February 2020  -unchanged May 2020.and Nov 2020    05/21/2019 -    Chief Complaint  Patient presents with  . Follow-up    PFT performed today. Pt states he has not been doing well since last visit. States his breathing has become worse. Pt is on 3L O2 at rest and 5L with exertion. Pt denies any complaints of cough or chest tightness.     HPI DELOYD HANDY 74 y.o. -last seen September 2020 for the above issues.  Presents with his wife.  He lives in [place].  After the visit in September 2020 he saw his rheumatology PA Marella Chimes.  She is worried about worsening interstitial lung disease because of worsening shortness of breath.  He and his wife tell me that his shortness of breath is significantly worsened.  This is reflected in the symptom scores below.  However his oxygen need appears to be the same.  He had pulmonary function test because of the concern of worsening ILD.  The pulmonary function test is actually better than a year ago.  He had a high-resolution CT chest 3 days ago.  I personally visualized the CT.  The report is below.  There is no comment about progression from Dr. Lorin Picket but in my personal visualization I do not think there is any progression.  I have written to Dr. Rosario Jacks to make a comment about progression or not.  Nevertheless he is having progressive shortness of breath.  He is sedentary.  He is overweight.  They are not been able to attend rehab because of the pandemic.  He is asking for albuterol nebulizer refill.       Ct  Chest High Resolution  Result Date: 05/18/2019 CLINICAL DATA:  Interstitial lung disease. Chronic oxygen therapy, extreme shortness of breath. EXAM: CT CHEST WITHOUT CONTRAST TECHNIQUE: Multidetector CT imaging of the chest was performed following the standard protocol without intravenous contrast. High resolution imaging of the lungs, as well as inspiratory and expiratory imaging, was performed. COMPARISON:  11/18/2018, 08/01/2018, 10/14/2006. FINDINGS: Cardiovascular: Atherosclerotic calcification of  the aorta and coronary arteries. Pulmonic trunk is enlarged. Heart size normal. No pericardial effusion. Mediastinum/Nodes: Mediastinal lymph nodes appear minimally less prominent. Index right paratracheal lymph node measures 10 mm, compared to 12 mm on 11/18/2018. Hilar regions are difficult to definitively evaluate without IV contrast. No axillary adenopathy. Esophagus is grossly unremarkable. Lungs/Pleura: Extensive cystic destruction of the lungs bilaterally, upper/midlung zone predominant. Superimposed patchy nodular consolidation in the left upper lobe is unchanged. Scattered subpleural reticular and ground-glass densities with traction bronchiolectasis, primarily in the lower lobes. Scattered pleural calcifications. No pleural fluid. No air trapping. Airway is unremarkable. Upper Abdomen: Visualized portions of the liver, gallbladder, adrenal glands, spleen, pancreas, stomach and bowel are grossly unremarkable. Musculoskeletal: Degenerative changes in the spine. No worrisome lytic or sclerotic lesions. IMPRESSION: 1. Extensive upper/midlung zone predominant cystic lung destruction/honeycombing with mild basilar subpleural reticular densities and traction bronchiolectasis. Scattered pleural calcifications and known asbestos exposure raise concern for asbestosis. Lack of craniocaudal gradient is considered nontypical for usual interstitial pneumonitis. Findings are indeterminate for UIP per consensus guidelines: Diagnosis of Idiopathic Pulmonary Fibrosis: An Official ATS/ERS/JRS/ALAT Clinical Practice Guideline. Am Rosezetta Schlatter Crit Care Med Vol 198, Iss 5, [phone], Mar 02 2017. 2. Stable irregular nodular consolidation in the left upper lobe. Additional follow-up could be obtained in 6-12 months, as clinically indicated. 3. Aortic atherosclerosis (ICD10-170.0). Coronary artery calcification. 4. Enlarged pulmonic trunk, indicative of pulmonary arterial hypertension. Electronically Signed   By: Leanna Battles M.D.    On: 05/18/2019 16:46      OV 09/16/2019  Subjective:  [name], male , DOB: 10-Dec-1945 , age 20 y.o. , MRN: [id number] , ADDRESS: [address] [place] [address]   09/16/2019 -   Chief Complaint  Patient presents with  . Follow-up     HPI [name] 74 y.o. -presents for follow-up of his interstitial lung disease pulmonary fibrosis which is multifactorial from asbestos exposure and rheumatoid arthritis.  He is here with his wife.  He reports slow progression.  Now even at 5 L pulsed he is not able to sustain a pulse ox.  He needs 5 L continuous.  He is not feeling any acute symptoms.  Is a chronic decline.  Symptom scores below suggest that.  2 days ago at the rheumatology clinic with his pulsed oxygen he was pulse oxing 60% and therefore he was not given his IV infusion of TNF alpha blockade for his rheumatoid arthritis.  But he is feeling well without any infectious symptoms.  He had pulmonary function test that shows decline as documented below.  He is interested now in the ILD-pro registry study.  His main frustration is that the pulsed oxygen is not adequate.  He continues his nintedanib.  Occasionally he feels nauseated especially if he takes it with the vitamin.  But he manages this.     SYMPTOM SCALE - ILD 08/21/2018  03/04/2019  05/21/2019  09/16/2019   O2 use 4LNC at rest 3 L [place] portable + 5L [place] at night rest 3 to 5 5L at rest  Shortness of Breath  0 -> 5 scale with 5 being worst (score 6 If unable to do)     At rest 1 0 2 1  Simple tasks - showers, clothes change, eating, shaving Household (dishes, doing bed, laundry) Shopping Walking level at own pace Walking up Stairs Total (40 - 48) Dyspnea Score 32  How bad is your cough? x   2  How bad is your fatigue x   4  nausea    3  vomit    0  diarhea    1  anxiety    3  depression    3   [name], [name] (MRN  [id number]) as of 09/16/2019 09:12  Ref. Range 03/20/2018 11:28 05/21/2019 08:54 08/25/2019 15:00  FVC-Pre Latest Units: L 1.61 2.04 1.57  FVC-%Pred-Pre Latest Units: % 36 47 36  Results for ALEKSI, BRUMMET (MRN [id number]) as of 09/16/2019 09:12  Ref. Range 03/20/2018 11:28 05/21/2019 08:54 08/25/2019 15:00  DLCO unc Latest Units: ml/min/mmHg 6.59 11.64 6.53  DLCO unc % pred Latest Units: % 19 45 25   ROS - per HPI     has a past medical history of Cataracts, bilateral, COPD (chronic obstructive pulmonary disease) (HCC), Hearing loss, Macular degeneration, and Rheumatoid arthritis (HCC).   reports that he quit smoking about 16 years ago. His smoking use included cigarettes. He has a 75.00 pack-year smoking history. He has never used smokeless tobacco.  Past Surgical History:  Procedure Laterality Date  . LUNG BIOPSY    . RIGHT HEART CATH N/A 08/08/2018   Procedure: RIGHT HEART CATH;  Surgeon: Dolores Patty, MD;  Location: [facility] INVASIVE CV LAB;  Service: Cardiovascular;  Laterality: N/A;    No Known Allergies  Immunization History  Administered Date(s) Administered  . Fluad Quad(high Dose 65+) 03/04/2019  . Influenza, High Dose Seasonal PF 08/21/2018  . Pneumococcal Conjugate-13 02/20/2013  . Pneumococcal Polysaccharide-23 03/26/2014    No family history on file.   Current Outpatient Medications:  .  albuterol (PROVENTIL) (2.5 MG/3ML) 0.083% nebulizer solution, Take 3 mLs (2.5 mg total) by nebulization every 6 (six) hours as needed for wheezing or shortness of breath., Disp: 150 mL, Rfl: 3 .  Artificial Tear Ointment (LUBRICANT EYE OP), Place 1-2 drops into both eyes daily. Gel Drops, Disp: , Rfl:  .  furosemide (LASIX) 20 MG tablet, Take 20 mg by mouth daily as needed for edema. , Disp: , Rfl: 1 .  ibuprofen (ADVIL,MOTRIN) 200 MG tablet, Take 600 mg by mouth every 6 (six) hours as needed., Disp: , Rfl:  .  mirtazapine (REMERON) 30 MG tablet, Take 30 mg by mouth at bedtime. ,  Disp: , Rfl:  .  mometasone-formoterol (DULERA) 100-5 MCG/ACT AERO, Inhale 2 puffs into the lungs 2 (two) times daily., Disp: , Rfl:  .  Multiple Vitamins-Minerals (PRESERVISION AREDS 2+MULTI VIT) CAPS, Take 1 capsule by mouth 2 (two) times daily., Disp: , Rfl:  .  Nintedanib (OFEV) 150 MG CAPS, Take 150 mg by mouth 2 (two) times daily., Disp: , Rfl:  .  Omega-3 Fatty Acids (FISH OIL) 1200 MG CAPS, Take 1,200 mg by mouth 2 (two) times daily. , Disp: , Rfl:  .  predniSONE (DELTASONE) 10 MG tablet, Take 10 mg by mouth 2 (two) times daily with a meal. , Disp: , Rfl:  .  Turmeric 500 MG CAPS, Take 500 mg by mouth daily. , Disp: , Rfl:       Objective:   Vitals:   09/16/19 0904  BP: 128/80  Pulse: (!) 103  Temp: (!) 97.4 F (36.3 C)  TempSrc: Oral  SpO2: 90%  Weight: 216 lb (98 kg)  Height: 5\' 11"  (1.803 m)    Estimated body mass index is 30.13 kg/m as calculated from the following:   Height as of this encounter: 5\' 11"  (1.803 m).   Weight as of this encounter: 216 lb (98 kg).  @WEIGHTCHANGE @    09/16/19 0904  Weight: 216 lb (98 kg)     Physical Exam Sit in wheel chair O2 on 5LNC -> 94% Crackles in lung base Clubbing + Visceral Obesity + Mask on       Assessment:       ICD-10-CM   1. Interstitial lung disease due to connective tissue disease (HCC)  J84.89    M35.9   2. History of asbestos exposure  Z77.090   3. Chronic respiratory failure with hypoxia (HCC)  J96.11   4. SOB (shortness of breath)  R06.02   5. Solitary pulmonary nodule  R91.1        Plan:     Patient Instructions  Interstitial lung disease due to connective tissue disease (HCC) History of asbestos exposure Chronic respiratory failure with hypoxia (HCC)   -Reason for pulmonary fibrosis is the rheumatoid arthritis with other issues such as asbestos, smoking and prior methotrexate may be contributing along with previous occupations   - Disease is now slolwy progressive -  Pulse o2 at 5L [place] is not working  PLAN  - continue albuterol prn    - will do refill for nebs  - continue ofev (glad your are tolerating it well other than mild nausea)     - check LFT 09/16/2019 - continue 5 LNC o2 at rest  - change from pulsed to continuous portable o2   - need to meet PCC/we will do an order for this - Not sure [employer] is helping you - you can give a trial without this - meet 09/18/19 of [employer] to participate in ILD-PRO registry 09/16/2019     Pulmonary venous hypertension  - due to above  - clinically well controlled with lasix  - continue lasix as before  Shortness of breath  - due to all of above + weight + deconditioning - manage this  Left upper lobe nodule 2.7 cm August 04, 2018 ->  May 2020 on CT chest  -> stble Nov 2020   repeat ct chest without contrast  in Sept 2021  Follow-up - Any worsening go to ER - return in 3 months - 30 min slot   (Level 04: Estb 30-39 min n  visit type: on-site physical face to visit visit spent in total care time and counseling or/and coordination of care by this undersigned MD - Dr June 2020. This includes one or more of the following on this same day 09/16/2019: pre-charting, chart review, note writing, documentation discussion of test results, diagnostic or treatment recommendations, prognosis, risks and benefits of management options, instructions, education, compliance or risk-factor reduction. It excludes time spent by the CMA or office staff in the care of the patient . Actual time is 37 min)    SIGNATURE    Dr. 04-02-1998, M.D., F.C.C.P,  Pulmonary and Critical Care Medicine Staff Physician, [facility] Director - Interstitial Lung Disease  Program  Pulmonary Fibrosis [facility] [place], [place], [address]  Pager: [phone], If no answer or between  15:00h - 7:00h: call [phone] Telephone: [phone]  9:37 AM 09/16/2019

## 2019-09-16 NOTE — Addendum Note (Signed)
Addended by: Demetrio Lapping E on: 09/16/2019 11:33 AM   Modules accepted: Orders

## 2019-09-16 NOTE — Telephone Encounter (Signed)
Please find out if they went to ER and got admitted or is he coming to office today.

## 2019-09-16 NOTE — Patient Instructions (Addendum)
Interstitial lung disease due to connective tissue disease (HCC) History of asbestos exposure Chronic respiratory failure with hypoxia (HCC)   -Reason for pulmonary fibrosis is the rheumatoid arthritis with other issues such as asbestos, smoking and prior methotrexate may be contributing along with previous occupations   - Disease is now slolwy progressive - Pulse o2 at 5L [place] is not working  PLAN  - continue albuterol prn    - will do refill for nebs  - continue ofev (glad your are tolerating it well other than mild nausea)     - check LFT 09/16/2019 - continue 5 LNC o2 at rest  - change from pulsed to continuous portable o2   - need to meet PCC/we will do an order for this - Not sure Elwin Sleight is helping you - you can give a trial without this - meet Mardella Layman of [facility] to participate in ILD-PRO registry 09/16/2019     Pulmonary venous hypertension  - due to above  - clinically well controlled with lasix  - continue lasix as before  Shortness of breath  - due to all of above + weight + deconditioning - manage this  Left upper lobe nodule 2.7 cm August 04, 2018 ->  May 2020 on CT chest  -> stble Nov 2020   repeat ct chest without contrast  in Sept 2021  Follow-up - Any worsening go to ER - return in 3 months - 30 min slot

## 2019-09-16 NOTE — Addendum Note (Signed)
Addended by: Cydney Ok on: 09/16/2019 10:03 AM   Modules accepted: Orders

## 2019-09-16 NOTE — Addendum Note (Signed)
Addended by: Cydney Ok on: 09/16/2019 11:32 AM   Modules accepted: Orders

## 2019-09-16 NOTE — Research (Signed)
Title: Chronic Fibrosing Interstitial Lung Disease with Progressive Phenotype Prospective Outcomes (ILD-PRO) Registry   Protocol #: IPF-PRO-SUB, Clinical Trials # [id number], Sponsor: [employer]  Protocol Version Amendment 4 dated 12Sep2019 and confirmed yescurrent on 09/16/2019 Consent Version for today's visit date of03/17/2021is Version Amendment 4 ([id number])  Objectives:  Marland Kitchen Describe current approaches to diagnosis and treatment of chronic fibrosing ILDs with progressive phenotype  . Describe the natural history of chronic fibrosing ILDs with progressive phenotype  . Assess quality of life from self-administered participant reported questionnaires for each disease group  . Describe participant interactions with the healthcare system, describe treatment practices across multiple institutions for each disease group  . Collect biological samples linked to well characterized chronic fibrosing ILDs with progressive phenotype to identify disease biomarkers  . Collect data and biological samples that will support future research studies.                                            Key Inclusion Criteria: Willing and able to provide informed consent  Age ? 30 years  Diagnosis of a non-IPF ILD of any duration, including, but not limited to Idiopathic Non-Specific Interstitial Pneumonia (INSIP), Unclassifiable Idiopathic Interstitial Pneumonias (IIPs), Interstitial Pneumonia with Autoimmune Features (IPAF), Autoimmune ILDs such as Rheumatoid Arthritis (RA-ILD) and Systemic Sclerosis (SSC-ILD), Chronic Hypersensitivity Pneumonitis (HP), Sarcoidosis or Exposure-related ILDs such as asbestosis.  Chronic fibrosing ILD defined by reticular abnormality with traction bronchiectasis with or without honeycombing confirmed by chest HRCT scan and/or lung biopsy.  Progressive phenotype as defined by fulfilling at least one of the criteria below of fibrotic changes (progression set  point) within the last 24 months regardless of treatment considered appropriate in individual ILDs:  . decline in FVC % predicted (% pred) based on >10% relative decline  . decline in FVC % pred based on ? 5 - <10% relative decline in FVC combined with worsening of respiratory symptoms as assessed by the site investigator  . decline in FVC % pred based on ? 5 - <10% relative decline in FVC combined with increasing extent of fibrotic changes on chest imaging (HRCT scan) as assessed by the site investigator  . decline in DLCO % pred based on ? 10% relative decline  . worsening of respiratory symptoms as well as increasing extent of fibrotic changes on chest imaging (HRCT scan) as assessed by the site investigator independent of FVC change.    Key Exclusion Criteria: Malignancy, treated or untreated, other than skin or early stage prostate cancer, within the past 5 years  Currently listed for lung transplantation at the time of enrollment  Currently enrolled in a clinical trial at the time of enrollment in this registry    Clinical Research Coordinator / Research RN note : This visit for Subject SAMULE LIFE with DOB: [birth year] on 09/16/2019 for the above protocol is Visit/Encounter # Baseline/Enrollment and is for purpose of research. The consent for this encounter is under Protocol Version Amendment 4 [phone]) and is currently IRB approved. Subject expressed continued interest and consent in continuing as a study subject. Subject confirmed that there was no change in contact information (e.g. address, telephone, email). Subject thanked for participation in research and contribution to science.   During this visit on 09/16/2019,the subject met with me in the office to discuss the protocol ICF and sign. The study coordinator went over the entire  ICF with the subject and the subject was given ample time to read and review the ICF. After review of the ICF,all questions were answered to the  subject's satisfaction.Dr. Linwood Dibbles the ICF with the patient, explained the purpose of research, and asked if there were any additional questions. He stated that thestudycoordinatorand doctorhave explained the study to him thoroughly and stated he had no additional questions. The subject,study coordinator,and PI signed the ICF and the subject was given a signed copy of the ICF for his records. After consent all study related procedures were conducted as per theabovestated protocol. For additional information on today's visit,please refer to subject's paper source binder.  Signed by  Hillman Assistant [facility]  [place], [place] 11:10 AM 09/16/2019

## 2019-09-17 ENCOUNTER — Telehealth: Payer: Self-pay | Admitting: Internal Medicine

## 2019-09-17 NOTE — Telephone Encounter (Signed)
Yesterday's OV note has been faxed. Nothing further needed.

## 2019-09-19 ENCOUNTER — Telehealth: Payer: Self-pay | Admitting: Internal Medicine

## 2019-09-19 NOTE — Telephone Encounter (Signed)
  LFTs up and could be due to ofev. Is 2X higher.   ASESSMENT Will send to [employer] for Drug Induced Liver Injury (DILI) assessment and  Plan  - can he stop fish oil ? - > does not benefit the heart. Can increase acid reflux which can make ILD worse  - pls ask if he is taking any tylenol containing drugs?  - why is he taking turmeric and for how long?  - why is he taking remeron and for how long?   - recheck LFT in 1 -2 weeks after change    Current Outpatient Medications:  .  albuterol (PROVENTIL) (2.5 MG/3ML) 0.083% nebulizer solution, Take 3 mLs (2.5 mg total) by nebulization every 6 (six) hours as needed for wheezing or shortness of breath., Disp: 150 mL, Rfl: 3 .  Artificial Tear Ointment (LUBRICANT EYE OP), Place 1-2 drops into both eyes daily. Gel Drops, Disp: , Rfl:  .  furosemide (LASIX) 20 MG tablet, Take 20 mg by mouth daily as needed for edema. , Disp: , Rfl: 1 .  ibuprofen (ADVIL,MOTRIN) 200 MG tablet, Take 600 mg by mouth every 6 (six) hours as needed., Disp: , Rfl:  .  mirtazapine (REMERON) 30 MG tablet, Take 30 mg by mouth at bedtime. , Disp: , Rfl:  .  mometasone-formoterol (DULERA) 100-5 MCG/ACT AERO, Inhale 2 puffs into the lungs 2 (two) times daily., Disp: , Rfl:  .  Multiple Vitamins-Minerals (PRESERVISION AREDS 2+MULTI VIT) CAPS, Take 1 capsule by mouth 2 (two) times daily., Disp: , Rfl:  .  Nintedanib (OFEV) 150 MG CAPS, Take 150 mg by mouth 2 (two) times daily., Disp: , Rfl:  .  Omega-3 Fatty Acids (FISH OIL) 1200 MG CAPS, Take 1,200 mg by mouth 2 (two) times daily. , Disp: , Rfl:  .  predniSONE (DELTASONE) 10 MG tablet, Take 10 mg by mouth 2 (two) times daily with a meal. , Disp: , Rfl:  .  Turmeric 500 MG CAPS, Take 500 mg by mouth daily. , Disp: , Rfl:     Results for ELMUS, MATHES (MRN [id number]) as of 09/19/2019 20:18  Ref. Range 05/21/2019 10:30 08/21/2019 07:15 08/25/2019 15:00 09/16/2019 11:34  AST Latest Ref Range: 0 - 37 U/L 18   75 (H)  ALT  Latest Ref Range: 0 - 53 U/L 22   68 (H)     Recent Labs  Lab 09/16/19 1134  AST 75*  ALT 68*  ALKPHOS 86  BILITOT 0.4  PROT 8.1  ALBUMIN 3.8

## 2019-09-21 NOTE — Telephone Encounter (Signed)
ATC patient unable to reach LM to call back office (x1)  

## 2019-09-22 NOTE — Telephone Encounter (Signed)
Called and spoke to [place] with [facility], she states the pt has switched to [employer] and insurance has capped with their payment. If pt were to switch to another DME then insurance would not pay the new DME and pt would have to pay out of pocket. Called and spoke to pt and informed him of this. Pt states he is unable to private pay and is requesting to change to another DME. Advised pt it will likely be the same issue because the issue is with insurance not the DME. Pt still requesting to try another DME. Pt has a home concentrator and his portable POC that only goes to 5lpm pulsed. Per [facility] pt's out of pocket cost would be $170 per month.   Dr. Marchelle Gearing please advise if ok to place an order to [facility] to see if they can get pt's portable continuous tanks.

## 2019-09-22 NOTE — Telephone Encounter (Signed)
Ok to place order with [employer]

## 2019-09-22 NOTE — Telephone Encounter (Signed)
New order placed for pt to switch DMEs.  Called and spoke with pt letting him know that this was done and he verbalized understanding.nothing further needed.

## 2019-09-24 NOTE — Telephone Encounter (Signed)
Called patient to discuss.  Notified him about elevated liver enzymes and the need for medication review to see if any medications are contributing.  Attempted to schedule an appointment but patient stated that he needs to check with his wife to schedule around her appointments. Patient states he will call back.  Patient is hard of hearing and needs to be an in-office appointment for review and lab work.  Pharmacy is not in the office on Friday's.   Verlin Fester, PharmD, [place], CPP Clinical Specialty Pharmacist [phone]  09/24/2019 1:58 PM

## 2019-09-28 ENCOUNTER — Telehealth: Payer: Self-pay | Admitting: Internal Medicine

## 2019-09-28 NOTE — Telephone Encounter (Signed)
I looked throughout the chart and could not see where patient was called recently. If you can see that he needs to call please reach out to his wife. Thank you

## 2019-09-28 NOTE — Telephone Encounter (Signed)
Please see encounter from 3/20.

## 2019-09-28 NOTE — Telephone Encounter (Signed)
Denice Paradise (Pt's ex-wife) returning call. No DPR on file.  [phone] (cell number for Denice Paradise).

## 2019-09-28 NOTE — Telephone Encounter (Signed)
Called and spoke with pt's ex wife Scarlette Calico in regards to [place] needing to get pt scheduled for an appt with pharmacy team so they can review pt's meds with him to see if there are any he is taking that might be contributing to elevated liver enzymes.  Scarlette Calico stated she would have to call us back to get appt scheduled for pt. Stated to her when she called the office back to state to front staff that she needed to get pt scheduled for an appt with pharmacy team and she verbalized understanding. Will leave encounter open.

## 2019-09-28 NOTE — Telephone Encounter (Signed)
ATC [name], [place]- LMTCB  Please schedule pharm clinic visit, this is all that is needed when pt calls back

## 2019-10-01 DIAGNOSIS — M0589 Other rheumatoid arthritis with rheumatoid factor of multiple sites: Secondary | ICD-10-CM | POA: Diagnosis not present

## 2019-10-01 NOTE — Telephone Encounter (Signed)
Pt is scheduled for a pharmacy visit 4/6. Nothing further needed.

## 2019-10-01 NOTE — Telephone Encounter (Signed)
pls see if a phone or video visit can be done with pharmacy  -if pharmacy can do it

## 2019-10-05 NOTE — Progress Notes (Signed)
HPI  Patient presents today to [facility] for medication reconciliation appt with pharmacy team. Pertinent past medical history includes ILD, rheumatoid arthritis, history of tobacco abuse, PAH. He is involved in a research study and followed by [facility]. He has been on Ofev since May 2019 and tolerates well.  Last hepatic panel showed elevated AST/ALT and concerned it may be due to Ofev.  Prior hepatic panel on 07/23/2019 was within normal limits.  OBJECTIVE No Known Allergies  Outpatient Encounter Medications as of 10/06/2019  Medication Sig  . albuterol (PROVENTIL) (2.5 MG/3ML) 0.083% nebulizer solution Take 3 mLs (2.5 mg total) by nebulization every 6 (six) hours as needed for wheezing or shortness of breath.  . Artificial Tear Ointment (LUBRICANT EYE OP) Place 1-2 drops into both eyes daily. Gel Drops  . furosemide (LASIX) 20 MG tablet Take 20 mg by mouth daily as needed for edema.   Marland Kitchen ibuprofen (ADVIL,MOTRIN) 200 MG tablet Take 600 mg by mouth every 6 (six) hours as needed.  . mirtazapine (REMERON) 30 MG tablet Take 30 mg by mouth at bedtime.   . mometasone-formoterol (DULERA) 100-5 MCG/ACT AERO Inhale 2 puffs into the lungs 2 (two) times daily.  . Multiple Vitamins-Minerals (PRESERVISION AREDS 2+MULTI VIT) CAPS Take 1 capsule by mouth 2 (two) times daily.  . Nintedanib (OFEV) 150 MG CAPS Take 150 mg by mouth 2 (two) times daily.  . Omega-3 Fatty Acids (FISH OIL) 1200 MG CAPS Take 1,200 mg by mouth 2 (two) times daily.   . predniSONE (DELTASONE) 10 MG tablet Take 10 mg by mouth 2 (two) times daily with a meal.   . Turmeric 500 MG CAPS Take 500 mg by mouth daily.    No facility-administered encounter medications on file as of 10/06/2019.     Immunization History  Administered Date(s) Administered  . Fluad Quad(high Dose 65+) 03/04/2019  . Influenza, High Dose Seasonal PF 08/21/2018  . Pneumococcal Conjugate-13 02/20/2013  . Pneumococcal Polysaccharide-23 03/26/2014      HRCT  PFT's TLC  Date Value Ref Range Status  03/20/2018 3.73 L Final     CMP     Component Value Date/Time   NA 143 08/08/2018 1332   K 3.8 08/08/2018 1332   CL 104 08/08/2018 0934   CO2 30 08/08/2018 0934   GLUCOSE 94 08/08/2018 0934   BUN 10 08/08/2018 0934   CREATININE 0.86 08/08/2018 0934   CALCIUM 8.9 08/08/2018 0934   PROT 8.1 09/16/2019 1134   ALBUMIN 3.8 09/16/2019 1134   AST 75 (H) 09/16/2019 1134   ALT 68 (H) 09/16/2019 1134   ALKPHOS 86 09/16/2019 1134   BILITOT 0.4 09/16/2019 1134   GFRNONAA >60 08/08/2018 0934   GFRAA >60 08/08/2018 0934     CBC    Component Value Date/Time   WBC 9.0 08/08/2018 0934   RBC 4.81 08/08/2018 0934   HGB 13.6 08/08/2018 1332   HCT 40.0 08/08/2018 1332   PLT 224 08/08/2018 0934   MCV 93.3 08/08/2018 0934   MCH 30.4 08/08/2018 0934   MCHC 32.5 08/08/2018 0934   RDW 12.8 08/08/2018 0934   LYMPHSABS 2.6 07/10/2017 1623   MONOABS 0.7 07/10/2017 1623   EOSABS 0.5 07/10/2017 1623   BASOSABS 0.1 07/10/2017 1623     LFT's Hepatic Function Latest Ref Rng & Units 09/16/2019 05/21/2019 03/04/2019  Total Protein 6.0 - 8.3 g/dL 8.1 7.5 7.6  Albumin 3.5 - 5.2 g/dL 3.8 4.1 4.2  AST 0 - 37 U/L 75(H) 18 21  ALT 0 - 53 U/L 68(H) 22 18  Alk Phosphatase 39 - 117 U/L 86 57 61  Total Bilirubin 0.2 - 1.2 mg/dL 0.4 0.7 0.4  Bilirubin, Direct 0.0 - 0.3 mg/dL 0.1 0.1 0.0    ASSESSMENT  1. Medication Reconciliation  A drug regimen assessment was performed, including review of allergies, interactions, disease-state management, dosing and immunization history. Medications were reviewed with the patient, including name, instructions, indication, goals of therapy, potential side effects, importance of adherence, and safe use.  Drug interaction(s):  Ibuprofen and Lasix-increased risk of nephrotoxicity Ibuprofen and prednisone- increased risk of a GI bleed  Current medications that can contribute to abnormal LFT's  Ofev-Increased liver  enzymes (13% to 14%)  Remicade- Increased serum alanine aminotransferase (<3 x ULN: 17% to 51%; ?3 x ULN: 2% to 10%; ?5x ULN: 1% to 4%)  Ibuprofen-Increased serum alanine aminotransferase (?15%), increased serum aspartate aminotransferase (?15%)**only uses as needed for headaches**  Turmeric- low rate of transient serum enzyme elevations and rarely acute liver injury (frequency not defined) **he is taking only 1-2 times a week**  Prednisone-Hepatomegaly, increased serum alanine aminotransferase, increased serum alkaline phosphatase, increased serum aspartate aminotransferase (frequency not defined)  Fish oil- Increased serum alanine aminotransferase, increased serum aspartate aminotransferase (frequency not defined) **he is taking as an alternative to statin**  Remeron- ALT/SGPT level raised (Up to 2% ), Aspartate aminotransferase serum level raised (Less than 1% ), Cirrhosis of liver (Less than 0.1% ), Liver function tests abnormal (0.1% to 1% )  Lasix-Hepatic encephalopathy, increased liver enzymes, intrahepatic cholestatic jaundice (frequency not defined) **taking as needed**  Patient has been on Ofev since May 2019.  He reports prednisone dose was increased in January and Remicade infusion dose was increased for his past 2 infusions.  Suspect that elevated LFTs could be due to recent medication changes versus Ofev.  Patient states he had lab work performed with his infusion last week and he received a message stating that his labs were stable.  Patient will have [facility] rheumatology fax [place] for results to review.  2. Ofev Medication Management  Denies obtaining medication from pharmacy without issues. Denies  missing doses of medication. Reports side effects from medication. He has infrequent diarrhea but is tolerable.  He has patient assistance through [employer] for [employer]. Will need to re-enroll in the new year.  3. Immunizations  Patient is up-to-date with annual influenza,  Prevnar 13, and COVID-19 vaccine.  Last Pneumovax 23 vaccine was in September 2015.  Patient is eligible for another dose of Pneumovax 23.  Recommend Shingrix vaccine.  PLAN  CONTINUE Ofev 150 mg twice daily  STOP tumeric and Advil as needed  Have [facility] fax most recent lab work   All questions encouraged and answered.  Instructed patient to call with any further questions or concerns.  Thank you for allowing pharmacy to participate in this patient's care.  This appointment required  110 minutes of patient care (this includes precharting, chart review, review of results, face-to-face care, etc.).   Mariella Saa, PharmD, [place], [place] Clinical Specialty Pharmacist [phone]  10/06/2019 4:35 PM

## 2019-10-06 ENCOUNTER — Other Ambulatory Visit: Payer: Self-pay

## 2019-10-06 ENCOUNTER — Ambulatory Visit (INDEPENDENT_AMBULATORY_CARE_PROVIDER_SITE_OTHER): Payer: Medicare Other | Admitting: Pharmacist

## 2019-10-06 DIAGNOSIS — Z5181 Encounter for therapeutic drug level monitoring: Secondary | ICD-10-CM

## 2019-10-06 NOTE — Patient Instructions (Addendum)
   CONTINUE Ofev 150 mg twice daily  STOP tumeric and Advil as needed  Have [facility] fax most recent lab work to [phone]

## 2019-10-07 DIAGNOSIS — M2550 Pain in unspecified joint: Secondary | ICD-10-CM | POA: Diagnosis not present

## 2019-10-07 DIAGNOSIS — J8410 Pulmonary fibrosis, unspecified: Secondary | ICD-10-CM | POA: Diagnosis not present

## 2019-10-07 DIAGNOSIS — M0589 Other rheumatoid arthritis with rheumatoid factor of multiple sites: Secondary | ICD-10-CM | POA: Diagnosis not present

## 2019-10-07 DIAGNOSIS — Z79899 Other long term (current) drug therapy: Secondary | ICD-10-CM | POA: Diagnosis not present

## 2019-10-07 DIAGNOSIS — J449 Chronic obstructive pulmonary disease, unspecified: Secondary | ICD-10-CM | POA: Diagnosis not present

## 2019-10-07 DIAGNOSIS — E669 Obesity, unspecified: Secondary | ICD-10-CM | POA: Diagnosis not present

## 2019-10-07 DIAGNOSIS — Z6830 Body mass index (BMI) 30.0-30.9, adult: Secondary | ICD-10-CM | POA: Diagnosis not present

## 2019-10-08 ENCOUNTER — Telehealth: Payer: Self-pay | Admitting: Internal Medicine

## 2019-10-08 DIAGNOSIS — R110 Nausea: Secondary | ICD-10-CM | POA: Diagnosis not present

## 2019-10-08 DIAGNOSIS — G4700 Insomnia, unspecified: Secondary | ICD-10-CM | POA: Diagnosis not present

## 2019-10-08 DIAGNOSIS — Z6830 Body mass index (BMI) 30.0-30.9, adult: Secondary | ICD-10-CM | POA: Diagnosis not present

## 2019-10-08 NOTE — Telephone Encounter (Signed)
I called [name] PA-c but the office was closed. Will try to call in the morning.

## 2019-10-09 NOTE — Telephone Encounter (Signed)
Called [facility] and spoke with [name] who is the nurse for [employer]. Stated to [name] that MR has been out of the office but will be returning Monday 4/12. Stated to [name] that we would have MR call Denny Peon Monday once he returns to discuss pt and meds and [name] verbalized understanding stating that she would update Erin.  Erin's cell is listed that she can be reached at. Her cell is [phone]. Routing to MR.

## 2019-10-13 ENCOUNTER — Telehealth: Payer: Self-pay | Admitting: Internal Medicine

## 2019-10-13 DIAGNOSIS — J9611 Chronic respiratory failure with hypoxia: Secondary | ICD-10-CM

## 2019-10-13 DIAGNOSIS — J849 Interstitial pulmonary disease, unspecified: Secondary | ICD-10-CM

## 2019-10-13 NOTE — Telephone Encounter (Signed)
Called and spoke with pt who stated that he wants to discontinue his O2 with [facility] as their oxygen tanks they sent him did not last him as long as they told him it would. Pt stated he will stick with [employer] for his oxygen instead.  Order placed to have his O2 picked up from [facility] per pt's request. Nothing further needed.

## 2019-10-20 NOTE — Telephone Encounter (Signed)
MR - please advise if you spoke with this office. Thanks.

## 2019-11-18 ENCOUNTER — Telehealth: Payer: Self-pay | Admitting: Internal Medicine

## 2019-11-18 ENCOUNTER — Other Ambulatory Visit: Payer: Self-pay

## 2019-11-18 ENCOUNTER — Ambulatory Visit (HOSPITAL_COMMUNITY)
Admission: RE | Admit: 2019-11-18 | Discharge: 2019-11-18 | Disposition: A | Discharge status: Home / Self Care | Payer: Medicare Other | Source: Ambulatory Visit | Attending: Internal Medicine | Admitting: Internal Medicine

## 2019-11-18 DIAGNOSIS — R911 Solitary pulmonary nodule: Secondary | ICD-10-CM

## 2019-11-18 DIAGNOSIS — J8410 Pulmonary fibrosis, unspecified: Secondary | ICD-10-CM | POA: Diagnosis not present

## 2019-11-18 DIAGNOSIS — J849 Interstitial pulmonary disease, unspecified: Secondary | ICD-10-CM

## 2019-11-18 DIAGNOSIS — R918 Other nonspecific abnormal finding of lung field: Secondary | ICD-10-CM

## 2019-11-18 DIAGNOSIS — R9389 Abnormal findings on diagnostic imaging of other specified body structures: Secondary | ICD-10-CM

## 2019-11-18 DIAGNOSIS — I313 Pericardial effusion (noninflammatory): Secondary | ICD-10-CM | POA: Diagnosis not present

## 2019-11-18 NOTE — Telephone Encounter (Signed)
  Thre might be a mass in the Left upper lobe  Plan  =- do PET scan  -a lso repeat spiro/dlco  Can do both at [facility] and come for followup with me next few weeks - he can come to [facility] if that is the only place for opening  CT Chest High Resolution  Result Date: 11/18/2019 CLINICAL DATA:  Follow-up fibrotic interstitial lung disease EXAM: CT CHEST WITHOUT CONTRAST TECHNIQUE: Multidetector CT imaging of the chest was performed following the standard protocol without intravenous contrast. High resolution imaging of the lungs, as well as inspiratory and expiratory imaging, was performed. COMPARISON:  05/18/2019, 11/18/2018, 08/01/2018, 10/14/2006 FINDINGS: Cardiovascular: Aortic atherosclerosis. Cardiomegaly. Scattered coronary artery calcifications. Trace pericardial effusion. Mediastinum/Nodes: Numerous enlarged mediastinal and hilar lymph nodes. Thyroid gland, trachea, and esophagus demonstrate no significant findings. Lungs/Pleura: Redemonstrated severe pulmonary fibrosis in an apical predominant pattern with relative sparing of the bilateral lung bases, featuring very extensive traction bronchiectasis, bronchiolectasis, and severe honeycombing. No significant air trapping on expiratory phase imaging. There is an interval increase in an area of ill-defined consolidation with internal air bronchograms in the central left upper lobe, measuring approximately 5.8 x 3.2 cm, previously approximately 4.5 x 1.8 cm when measured similarly (series 8, image 85). Underlying fibrosis is not perceptibly changed otherwise although very substantially worsened in comparison to remote prior CT dated 2008. No pleural effusion or pneumothorax. Upper Abdomen: No acute abnormality. Musculoskeletal: No chest wall mass or suspicious bone lesions identified. IMPRESSION: 1. Redemonstrated severe pulmonary fibrosis in an apical predominant pattern with relative sparing of the bilateral lung bases, featuring very extensive  traction bronchiectasis, bronchiolectasis, and severe honeycombing. No significant air trapping on expiratory phase imaging. Although distribution is unusual, constellation of findings is most consistent with UIP pattern fibrosis. Findings are consistent with UIP per consensus guidelines: Diagnosis of Idiopathic Pulmonary Fibrosis: An Official ATS/ERS/JRS/ALAT Clinical Practice Guideline. Am Rosezetta Schlatter Crit Care Med Vol 198, Iss 5, [phone], Mar 02 2017. 2. There is an interval increase in an area of ill-defined consolidation with internal air bronchograms in the central left upper lobe, measuring approximately 5.8 x 3.2 cm, previously approximately 4.5 x 1.8 cm when measured similarly, likely an area of dense, confluent fibrosis and/or massive fibrosis. Malignancy is again not strictly excluded. This could be characterized for metabolic activity by FDG PET if desired. 3. Underlying fibrosis is otherwise not perceptibly changed in the immediate interval although very substantially worsened in comparison to remote prior CT dated 2008. 4. Redemonstrated enlarged mediastinal hilar lymph nodes, likely reactive in the setting of fibrosis. 5. Cardiomegaly and trace pericardial effusion. 6. Coronary artery disease.  Aortic Atherosclerosis (ICD10-I70.0). Electronically Signed   By: Lauralyn Primes M.D.   On: 11/18/2019 11:44

## 2019-11-19 NOTE — Telephone Encounter (Signed)
Called and spoke with Thelma Barge [facility]) letting her know the results of pt's CT and stated to her that we were going to order PET to further visualize what was seen on the CT and she verbalized understanding. Also stated to her that we were going to repeat PFT. Orders have been placed for both. Nothing further needed.

## 2019-11-20 NOTE — Telephone Encounter (Signed)
At this point I cannot remember.  I do remember leaving missed phone call for [name] the PA.  There are new findings in his CT scan of the chest.  He needs follow-up after finishing all that work-up.  See yesterday's phone note.  I will close this message

## 2019-11-23 ENCOUNTER — Other Ambulatory Visit: Payer: Self-pay

## 2019-11-23 ENCOUNTER — Ambulatory Visit (HOSPITAL_COMMUNITY)
Admission: RE | Admit: 2019-11-23 | Discharge: 2019-11-23 | Disposition: A | Discharge status: Home / Self Care | Payer: Medicare Other | Source: Ambulatory Visit | Attending: Internal Medicine | Admitting: Internal Medicine

## 2019-11-23 DIAGNOSIS — R9389 Abnormal findings on diagnostic imaging of other specified body structures: Secondary | ICD-10-CM | POA: Diagnosis not present

## 2019-11-23 DIAGNOSIS — R911 Solitary pulmonary nodule: Secondary | ICD-10-CM | POA: Diagnosis not present

## 2019-11-23 DIAGNOSIS — R918 Other nonspecific abnormal finding of lung field: Secondary | ICD-10-CM | POA: Diagnosis not present

## 2019-11-23 MED ORDER — FLUDEOXYGLUCOSE F - 18 (FDG) INJECTION
13.0000 | Freq: Once | INTRAVENOUS | Status: AC | PRN
  Administered 2019-11-23: 13 via INTRAVENOUS

## 2019-11-25 ENCOUNTER — Telehealth: Payer: Self-pay | Admitting: Internal Medicine

## 2019-11-25 NOTE — Telephone Encounter (Signed)
His PET scan is abnormal - LUL nodule is active. Other nodues - this can be just the fibrosis, can be rheumatoid nodule, can be lung cancer  plna   - refer Byrum/Icard for eval - if he wants to go over results with me first - pls give tele visit at 8.45am next week - return to see me in July 2021   NM PET Image Initial (PI) Skull Base To Thigh  Result Date: 11/24/2019 CLINICAL DATA:  Initial treatment strategy for pulmonary nodule. EXAM: NUCLEAR MEDICINE PET SKULL BASE TO THIGH TECHNIQUE: 13.0 mCi F-18 FDG was injected intravenously. Full-ring PET imaging was performed from the skull base to thigh after the radiotracer. CT data was obtained and used for attenuation correction and anatomic localization. Fasting blood glucose: 101 mg/dl COMPARISON:  High-resolution chest CT 11/18/2019 FINDINGS: Mediastinal blood pool activity: SUV max 3.1 Liver activity: SUV max NA NECK: No hypermetabolic lymph nodes in the neck. Incidental CT findings: none CHEST: No FDG avid axillary or supraclavicular lymph nodes. High prominent mediastinal lymph nodes are identified: -right paratracheal lymph node measures 1.1 cm and has an SUV max of 2.5, image 93/3 -subcarinal lymph node measures 1.4 cm and has an SUV max of 4.19. -SUV max within the left hilar region is equal to 4.28. Right hilar activity has an SUV max of 3.3. -corresponding to the findings from recent high-resolution CT of the chest is a large focus of intense radiotracer uptake corresponding to the progressive confluent density within the left upper lobe. On today's study the this area measures approximately 7.0 x 3.7 cm and has an SUV max of 9.09. -Within the perifissural right upper lobe there is a small focus of moderate FDG uptake within SUV max of 5.35. On the corresponding CT images there is a 7 mm nodular density, image 116/3. -Within the anterolateral right lower lobe there is a small focus of moderate FDG uptake with an SUV max of 4.60. On the  corresponding CT images there is a 1.1 cm area of nodularity, image 130/3. Incidental CT findings: There are severe changes of pulmonary fibrosis are again identified with extensive cystic lung honeycombing with mild basilar subpleural reticulation and traction bronchiolectasis. On the corresponding CT images there is diffusely increased tracer uptake throughout both lobes secondary to inflammation. Mild cardiac enlargement. Trace pericardial effusion. Aortic atherosclerosis. Lad and left circumflex coronary artery calcification. ABDOMEN/PELVIS: No abnormal hypermetabolic activity within the liver, pancreas, adrenal glands, or spleen. No hypermetabolic lymph nodes in the abdomen or pelvis. Incidental CT findings: Aortic atherosclerosis. Left kidney stones. Small bladder diverticula identified. SKELETON: No focal hypermetabolic activity to suggest skeletal metastasis. Incidental CT findings: none IMPRESSION: 1. There is moderate to marked increased FDG uptake corresponding to the recently described progressive, confluent area of increased consolidation in the left upper lobe with SUV max of 9.09. In the setting of severe pulmonary fibrosis this finding is somewhat nonspecific and may represent acute on chronic inflammatory change. Underlying malignancy is not excluded. 2. Within the right upper lobe and right lower lobe there are 2 additional areas of increased radiotracer uptake above background lung activity with corresponding small nodular foci on the CT images. This is also a nonspecific finding (in the setting of advanced pulmonary fibrosis). Small foci of FDG avid malignancy not excluded. 3. Prominent, non pathologically enlarged mediastinal and hilar lymph nodes are identified which exhibits mildly increased FDG uptake, nonspecific in the setting of advanced pulmonary fibrosis. 4.  Aortic Atherosclerosis (ICD10-I70.0). Electronically Signed  By: Kerby Moors M.D.   On: 11/24/2019 09:16

## 2019-11-27 NOTE — Telephone Encounter (Signed)
Called and spoke with Scarlette Calico [facility]) letting her know the results of pt's PET and stated that we needed to get pt scheduled for an appt with either Dr. Delton Coombes or Dr. Tonia Brooms. She verbalized understanding. Appt scheduled for pt 6/16 with Dr. Tonia Brooms.nothing further needed.

## 2019-12-03 DIAGNOSIS — M0589 Other rheumatoid arthritis with rheumatoid factor of multiple sites: Secondary | ICD-10-CM | POA: Diagnosis not present

## 2019-12-03 DIAGNOSIS — J8410 Pulmonary fibrosis, unspecified: Secondary | ICD-10-CM | POA: Diagnosis not present

## 2019-12-09 DIAGNOSIS — J8410 Pulmonary fibrosis, unspecified: Secondary | ICD-10-CM | POA: Diagnosis not present

## 2019-12-09 DIAGNOSIS — J449 Chronic obstructive pulmonary disease, unspecified: Secondary | ICD-10-CM | POA: Diagnosis not present

## 2019-12-09 DIAGNOSIS — Z79899 Other long term (current) drug therapy: Secondary | ICD-10-CM | POA: Diagnosis not present

## 2019-12-09 DIAGNOSIS — G894 Chronic pain syndrome: Secondary | ICD-10-CM | POA: Diagnosis not present

## 2019-12-09 DIAGNOSIS — E663 Overweight: Secondary | ICD-10-CM | POA: Diagnosis not present

## 2019-12-09 DIAGNOSIS — Z6828 Body mass index (BMI) 28.0-28.9, adult: Secondary | ICD-10-CM | POA: Diagnosis not present

## 2019-12-09 DIAGNOSIS — M2550 Pain in unspecified joint: Secondary | ICD-10-CM | POA: Diagnosis not present

## 2019-12-09 DIAGNOSIS — M0589 Other rheumatoid arthritis with rheumatoid factor of multiple sites: Secondary | ICD-10-CM | POA: Diagnosis not present

## 2019-12-16 ENCOUNTER — Ambulatory Visit: Payer: Medicare Other | Admitting: Pulmonary Disease

## 2019-12-16 ENCOUNTER — Other Ambulatory Visit: Payer: Self-pay

## 2019-12-16 ENCOUNTER — Ambulatory Visit (INDEPENDENT_AMBULATORY_CARE_PROVIDER_SITE_OTHER): Payer: Medicare Other | Admitting: Primary Care

## 2019-12-16 ENCOUNTER — Telehealth: Payer: Self-pay | Admitting: Primary Care

## 2019-12-16 ENCOUNTER — Encounter: Payer: Self-pay | Admitting: Primary Care

## 2019-12-16 DIAGNOSIS — J849 Interstitial pulmonary disease, unspecified: Secondary | ICD-10-CM

## 2019-12-16 DIAGNOSIS — J9611 Chronic respiratory failure with hypoxia: Secondary | ICD-10-CM | POA: Diagnosis not present

## 2019-12-16 DIAGNOSIS — R918 Other nonspecific abnormal finding of lung field: Secondary | ICD-10-CM | POA: Diagnosis not present

## 2019-12-16 MED ORDER — NYSTATIN 100000 UNIT/GM EX CREA: 1.0000 "application " | TOPICAL_CREAM | Freq: Two times a day (BID) | CUTANEOUS | 3 refills | Status: AC

## 2019-12-16 NOTE — Patient Instructions (Addendum)
PET scan showed left upper lobe mass had radioactive activity without uptake max of 9, negative for severe pulmonary fibrosis this finding is somewhat nonspecific may represent acute on chronic inflammatory changes but underlying malignancy is not excluded  I will discuss with Dr. Tonia Brooms if you would be a candidate for bronchoscopy for tissue sample get back to you by next week  Recommendations: Continue Ofev 150 mg twice daily Continue oxygen 3 L pulsed at rest and 5 L pulsed on exertion  Follow-up: Needs appointment in July with Dr. Marchelle Gearing 30-minute slot in ILD clinic Complete pulmonary function testing as scheduled at the end of the month

## 2019-12-16 NOTE — Telephone Encounter (Signed)
Dr. Tonia Brooms,   This is a patient of MR who has a long hx ILD. On OFEV and oxygen. Can you look at this patients PET scan from 11/24/19 to  which showed moderate uptake progressive consolidation LUL with max SUV 9.09.  In the setting of severe pulmonary fibrosis this finding is somewhat nonspecific and may represent acute on chronic inflammatory change, underlying malignancy is not excluded.   Within right upper lobe right lower lobe of the lung there are 2 additional areas of increased radiotracer uptake, this is also nonspecific in the setting of advanced pulmonary fibrosis.  He is on 3L oxygen pulsed at rest and 5L pulsed on exertion. He reports moderate-severe dyspnea when walking.  Recent spirometry showed FEV1 1.38 L (84%), ratio 88

## 2019-12-16 NOTE — Progress Notes (Signed)
@Patient  ID: , male    DOB: 1945-07-29, 74 y.o.   MRN: [id number]  Chief Complaint  Patient presents with  . Follow-up    pt is here to go over pet results    Referring provider: [id number], MD  HPI: 74 year old male, former smoker (75-pack-year history).  Past medical history significant for interstitial lung disease, pulmonary nodule left upper lobe.  Patient of Dr. 61, last seen September 16, 2019.  Patient recently broke relocated from September 18, 2019 and was originally seen by Dr. [place] in January 2020  Pulmonary fibrosis related to rheumatoid arthritis and asbestos exposure, smoking and prior methotrexate.  Disease is slowly progressive.  Continue Ofev 150mg  twice daily.  Trial off Dulera.  Patient is on pulsed oxygen at 5 L.  12/16/2019 Patient presents today for follow-up visit to review PET scan results. PET scan abnormal, LUL with SUV uptake 9.  Other nodule seen on PET scan could either be 5 fibrosis, rheumatoid nodules or lung cancer.  Patient needs referral to Dr. or Dr. 12/18/2019 for evaluation for possible bronchoscopy with biopsy.  Breathing is poor, he experiences moderate to severe dyspnea with activity. He wears 3L pulsed on oxygen at rest and 5L pulsed oxygen on exertion. He is tolerating OFEV 150mg  twice daily. He has a productive cough with yellow mucus. He has chest tightness when coughing.   PET scan on May 25 showed moderate uptake corresponding to recently described progressive consolidation left upper lobe with max SUV 9.09.  In the setting of severe pulmonary fibrosis this finding is somewhat nonspecific and may represent acute on chronic inflammatory change, underlying malignancy is not excluded  Within right upper lobe AND right lower lobe of the lung there are 2 additional areas of increased radiotracer uptake, this is also nonspecific in the setting of advanced pulmonary fibrosis.   Pre-Bronch Post-Bronch Actual Pred %Pred Actual  %Pred %Chng ---- SPIROMETRY ---- FVC (L) 1.57 4.32 (36%) FEV1 (L) 1.38 3.13 (44%) FEV1/FVC (%) 88 73 (121%)  Imaging: 11/18/19 HIGH RESOLUTION CT CHEST  1. Redemonstrated severe pulmonary fibrosis in an apical predominant pattern with relative sparing of the bilateral lung bases, featuring very extensive traction bronchiectasis, bronchiolectasis, and severe honeycombing. No significant air trapping on expiratory phase imaging. Although distribution is unusual, constellation of findings is most consistent with UIP pattern fibrosis.  2. There is an interval increase in an area of ill-defined consolidation with internal air bronchograms in the central left upper lobe, measuring approximately 5.8 x 3.2 cm, previously approximately 4.5 x 1.8 cm when measured similarly, likely an area of dense, confluent fibrosis and/or massive fibrosis. Malignancy is again not strictly excluded. 3. Underlying fibrosis is otherwise not perceptibly changed in the immediate interval although very substantially worsened in comparison to remote prior CT dated 2008. 4. Redemonstrated enlarged mediastinal hilar lymph nodes, likely reactive in the setting of fibrosis.  No Known Allergies  Immunization History  Administered Date(s) Administered  . Fluad Quad(high Dose 65+) 03/04/2019  . Influenza, High Dose Seasonal PF 08/21/2018  . Influenza-Unspecified 04/05/2019  . Moderna SARS-COVID-2 Vaccination 09/01/2019, 10/02/2019  . Pneumococcal Conjugate-13 02/20/2013  . Pneumococcal Polysaccharide-23 03/26/2014    Past Medical History:  Diagnosis Date  . Cataracts, bilateral   . COPD (chronic obstructive pulmonary disease) (HCC)    chronic O2 4 L  . Hearing loss   . Macular degeneration   . Rheumatoid arthritis (HCC)     Tobacco History: Social History   Tobacco Use  Smoking  Status Former Smoker  . Packs/day: 3.00  . Years: 25.00  . Pack years: 75.00  . Types: Cigarettes  . Quit date: 07/23/2003    . Years since quitting: 16.4  Smokeless Tobacco Never Used  Tobacco Comment   smoked betweek 2-3ppd when smoked   Counseling given: Not Answered Comment: smoked betweek 2-3ppd when smoked   Outpatient Medications Prior to Visit  Medication Sig Dispense Refill  . albuterol (PROVENTIL) (2.5 MG/3ML) 0.083% nebulizer solution Take 3 mLs (2.5 mg total) by nebulization every 6 (six) hours as needed for wheezing or shortness of breath. 150 mL 3  . Artificial Tear Ointment (LUBRICANT EYE OP) Place 1-2 drops into both eyes daily. Gel Drops    . furosemide (LASIX) 20 MG tablet Take 20 mg by mouth daily as needed for edema.   1  . ibuprofen (ADVIL,MOTRIN) 200 MG tablet Take 600 mg by mouth every 6 (six) hours as needed.    . mirtazapine (REMERON) 30 MG tablet Take 30 mg by mouth at bedtime.     . mometasone-formoterol (DULERA) 100-5 MCG/ACT AERO Inhale 2 puffs into the lungs 2 (two) times daily.    . Multiple Vitamins-Minerals (PRESERVISION AREDS 2+MULTI VIT) CAPS Take 1 capsule by mouth 2 (two) times daily.    . Nintedanib (OFEV) 150 MG CAPS Take 150 mg by mouth 2 (two) times daily.    . Omega-3 Fatty Acids (FISH OIL) 1200 MG CAPS Take 1,200 mg by mouth 2 (two) times daily.     . predniSONE (DELTASONE) 10 MG tablet Take 10 mg by mouth 2 (two) times daily with a meal.     . Turmeric 500 MG CAPS Take 500 mg by mouth daily.     Marland Kitchen sulfamethoxazole-trimethoprim (BACTRIM DS) 800-160 MG tablet      No facility-administered medications prior to visit.   Review of Systems  Review of Systems  Constitutional: Positive for fatigue.  Respiratory: Positive for cough and shortness of breath.    Physical Exam  BP 122/78 (BP Location: Left Arm, Cuff Size: Normal)   Pulse (!) 57   Temp 98.2 F (36.8 C) (Oral)   Ht 5\' 11"  (1.803 m)   Wt 198 lb 6.4 oz (90 kg)   SpO2 100%   BMI 27.67 kg/m  Physical Exam Constitutional:      Appearance: Normal appearance.  Cardiovascular:     Rate and Rhythm:  Regular rhythm.     Comments: No edema Pulmonary:     Effort: Pulmonary effort is normal.     Breath sounds: Rales present. No wheezing.     Comments: 3L pulsed oxygen Musculoskeletal:     Comments: In WC  Neurological:     General: No focal deficit present.     Mental Status: He is alert and oriented to person, place, and time. Mental status is at baseline.  Psychiatric:        Mood and Affect: Mood normal.        Behavior: Behavior normal.        Thought Content: Thought content normal.        Judgment: Judgment normal.      Lab Results:  CBC    Component Value Date/Time   WBC 9.0 08/08/2018 0934   RBC 4.81 08/08/2018 0934   HGB 13.6 08/08/2018 1332   HCT 40.0 08/08/2018 1332   PLT 224 08/08/2018 0934   MCV 93.3 08/08/2018 0934   MCH 30.4 08/08/2018 0934   MCHC 32.5 08/08/2018 0934  RDW 12.8 08/08/2018 0934   LYMPHSABS 2.6 07/10/2017 1623   MONOABS 0.7 07/10/2017 1623   EOSABS 0.5 07/10/2017 1623   BASOSABS 0.1 07/10/2017 1623    BMET    Component Value Date/Time   NA 143 08/08/2018 1332   K 3.8 08/08/2018 1332   CL 104 08/08/2018 0934   CO2 30 08/08/2018 0934   GLUCOSE 94 08/08/2018 0934   BUN 10 08/08/2018 0934   CREATININE 0.86 08/08/2018 0934   CALCIUM 8.9 08/08/2018 0934   GFRNONAA >60 08/08/2018 0934   GFRAA >60 08/08/2018 0934    BNP No results found for: BNP  ProBNP No results found for: PROBNP  Imaging: CT Chest High Resolution  Result Date: 11/18/2019 CLINICAL DATA:  Follow-up fibrotic interstitial lung disease EXAM: CT CHEST WITHOUT CONTRAST TECHNIQUE: Multidetector CT imaging of the chest was performed following the standard protocol without intravenous contrast. High resolution imaging of the lungs, as well as inspiratory and expiratory imaging, was performed. COMPARISON:  05/18/2019, 11/18/2018, 08/01/2018, 10/14/2006 FINDINGS: Cardiovascular: Aortic atherosclerosis. Cardiomegaly. Scattered coronary artery calcifications. Trace  pericardial effusion. Mediastinum/Nodes: Numerous enlarged mediastinal and hilar lymph nodes. Thyroid gland, trachea, and esophagus demonstrate no significant findings. Lungs/Pleura: Redemonstrated severe pulmonary fibrosis in an apical predominant pattern with relative sparing of the bilateral lung bases, featuring very extensive traction bronchiectasis, bronchiolectasis, and severe honeycombing. No significant air trapping on expiratory phase imaging. There is an interval increase in an area of ill-defined consolidation with internal air bronchograms in the central left upper lobe, measuring approximately 5.8 x 3.2 cm, previously approximately 4.5 x 1.8 cm when measured similarly (series 8, image 85). Underlying fibrosis is not perceptibly changed otherwise although very substantially worsened in comparison to remote prior CT dated 2008. No pleural effusion or pneumothorax. Upper Abdomen: No acute abnormality. Musculoskeletal: No chest wall mass or suspicious bone lesions identified. IMPRESSION: 1. Redemonstrated severe pulmonary fibrosis in an apical predominant pattern with relative sparing of the bilateral lung bases, featuring very extensive traction bronchiectasis, bronchiolectasis, and severe honeycombing. No significant air trapping on expiratory phase imaging. Although distribution is unusual, constellation of findings is most consistent with UIP pattern fibrosis. Findings are consistent with UIP per consensus guidelines: Diagnosis of Idiopathic Pulmonary Fibrosis: An Official ATS/ERS/JRS/ALAT Clinical Practice Guideline. [place], Iss 5, [phone], Mar 02 2017. 2. There is an interval increase in an area of ill-defined consolidation with internal air bronchograms in the central left upper lobe, measuring approximately 5.8 x 3.2 cm, previously approximately 4.5 x 1.8 cm when measured similarly, likely an area of dense, confluent fibrosis and/or massive fibrosis. Malignancy is again  not strictly excluded. This could be characterized for metabolic activity by FDG PET if desired. 3. Underlying fibrosis is otherwise not perceptibly changed in the immediate interval although very substantially worsened in comparison to remote prior CT dated 2008. 4. Redemonstrated enlarged mediastinal hilar lymph nodes, likely reactive in the setting of fibrosis. 5. Cardiomegaly and trace pericardial effusion. 6. Coronary artery disease.  Aortic Atherosclerosis (ICD10-I70.0). Electronically Signed   By: Eddie Candle M.D.   On: 11/18/2019 11:44   NM PET Image Initial (PI) Skull Base To Thigh  Result Date: 11/24/2019 CLINICAL DATA:  Initial treatment strategy for pulmonary nodule. EXAM: NUCLEAR MEDICINE PET SKULL BASE TO THIGH TECHNIQUE: 13.0 mCi F-18 FDG was injected intravenously. Full-ring PET imaging was performed from the skull base to thigh after the radiotracer. CT data was obtained and used for attenuation correction and anatomic localization. Fasting  blood glucose: 101 mg/dl COMPARISON:  High-resolution chest CT 11/18/2019 FINDINGS: Mediastinal blood pool activity: SUV max 3.1 Liver activity: SUV max NA NECK: No hypermetabolic lymph nodes in the neck. Incidental CT findings: none CHEST: No FDG avid axillary or supraclavicular lymph nodes. High prominent mediastinal lymph nodes are identified: -right paratracheal lymph node measures 1.1 cm and has an SUV max of 2.5, image 93/3 -subcarinal lymph node measures 1.4 cm and has an SUV max of 4.19. -SUV max within the left hilar region is equal to 4.28. Right hilar activity has an SUV max of 3.3. -corresponding to the findings from recent high-resolution CT of the chest is a large focus of intense radiotracer uptake corresponding to the progressive confluent density within the left upper lobe. On today's study the this area measures approximately 7.0 x 3.7 cm and has an SUV max of 9.09. -Within the perifissural right upper lobe there is a small focus of  moderate FDG uptake within SUV max of 5.35. On the corresponding CT images there is a 7 mm nodular density, image 116/3. -Within the anterolateral right lower lobe there is a small focus of moderate FDG uptake with an SUV max of 4.60. On the corresponding CT images there is a 1.1 cm area of nodularity, image 130/3. Incidental CT findings: There are severe changes of pulmonary fibrosis are again identified with extensive cystic lung honeycombing with mild basilar subpleural reticulation and traction bronchiolectasis. On the corresponding CT images there is diffusely increased tracer uptake throughout both lobes secondary to inflammation. Mild cardiac enlargement. Trace pericardial effusion. Aortic atherosclerosis. Lad and left circumflex coronary artery calcification. ABDOMEN/PELVIS: No abnormal hypermetabolic activity within the liver, pancreas, adrenal glands, or spleen. No hypermetabolic lymph nodes in the abdomen or pelvis. Incidental CT findings: Aortic atherosclerosis. Left kidney stones. Small bladder diverticula identified. SKELETON: No focal hypermetabolic activity to suggest skeletal metastasis. Incidental CT findings: none IMPRESSION: 1. There is moderate to marked increased FDG uptake corresponding to the recently described progressive, confluent area of increased consolidation in the left upper lobe with SUV max of 9.09. In the setting of severe pulmonary fibrosis this finding is somewhat nonspecific and may represent acute on chronic inflammatory change. Underlying malignancy is not excluded. 2. Within the right upper lobe and right lower lobe there are 2 additional areas of increased radiotracer uptake above background lung activity with corresponding small nodular foci on the CT images. This is also a nonspecific finding (in the setting of advanced pulmonary fibrosis). Small foci of FDG avid malignancy not excluded. 3. Prominent, non pathologically enlarged mediastinal and hilar lymph nodes are  identified which exhibits mildly increased FDG uptake, nonspecific in the setting of advanced pulmonary fibrosis. 4.  Aortic Atherosclerosis (ICD10-I70.0). Electronically Signed   By: Signa Kell M.D.   On: 11/24/2019 09:16     Assessment & Plan:   Pulmonary nodules - PET scan on 11/24/19 showed moderate uptake to LUL progressive consolidation with max SUV 9.90, measuring approximately 5.8 x 3.2 cm. He also has two additional areas of radiotracer uptake in his RUL and RLL.   - In the setting of advance pulmonary fibrosis these findings are nonspecific, may represent acute on chronic inflammatory changes, RA nodules or underlying malignancy   - I will discuss with Dr. Tonia Brooms, patient would be high risk for bronchoscopy d/t chronic respiratory failure  ILD (interstitial lung disease) (HCC) - Progressive disease. Patient experiencing moderate dyspnea on exertion with associated cough and chest tightness - Pulmonary fibrosis is related to  RA, asbestos exposure, smoking and prior methotrexate use  - Spirometry showed FEV1 1.38 (44%), ratio 88 - Tolerating Ofev 150mg  twice a day  - Scheduled for pulmonary function testing at the end of the month - FU in July with Dr. August   Chronic respiratory failure (HCC) - Maintained on 3-5L pulsed oxygen - Skin breakdown behind ears d/t oxygen tubing, nystatin cream sent to pharmacy   Marchelle Gearing, NP 12/18/2019

## 2019-12-17 DIAGNOSIS — M0589 Other rheumatoid arthritis with rheumatoid factor of multiple sites: Secondary | ICD-10-CM | POA: Diagnosis not present

## 2019-12-17 DIAGNOSIS — J8410 Pulmonary fibrosis, unspecified: Secondary | ICD-10-CM | POA: Diagnosis not present

## 2019-12-18 DIAGNOSIS — R918 Other nonspecific abnormal finding of lung field: Secondary | ICD-10-CM | POA: Insufficient documentation

## 2019-12-18 DIAGNOSIS — J849 Interstitial pulmonary disease, unspecified: Secondary | ICD-10-CM | POA: Insufficient documentation

## 2019-12-18 DIAGNOSIS — J9610 Chronic respiratory failure, unspecified whether with hypoxia or hypercapnia: Secondary | ICD-10-CM | POA: Insufficient documentation

## 2019-12-18 NOTE — Assessment & Plan Note (Addendum)
-   Maintained on 3-5L pulsed oxygen - Skin breakdown behind ears d/t oxygen tubing, nystatin cream sent to pharmacy

## 2019-12-18 NOTE — Assessment & Plan Note (Addendum)
-   PET scan on 11/24/19 showed moderate uptake to LUL progressive consolidation with max SUV 9.90, measuring approximately 5.8 x 3.2 cm. He also has two additional areas of radiotracer uptake in his RUL and RLL.   - In the setting of advance pulmonary fibrosis these findings are nonspecific, may represent acute on chronic inflammatory changes, RA nodules or underlying malignancy   - I will discuss with Dr. Tonia Brooms, patient would be high risk for bronchoscopy d/t chronic respiratory failure

## 2019-12-18 NOTE — Assessment & Plan Note (Addendum)
-   Progressive disease. Patient experiencing moderate dyspnea on exertion with associated cough and chest tightness - Pulmonary fibrosis is related to RA, asbestos exposure, smoking and prior methotrexate use  - Spirometry showed FEV1 1.38 (44%), ratio 88 - Tolerating Ofev 150mg  twice a day  - Scheduled for pulmonary function testing at the end of the month - FU in July with Dr. August

## 2019-12-22 NOTE — Telephone Encounter (Signed)
Please order HRCT in 6 months re: ILD/pulmonary nodules

## 2019-12-22 NOTE — Telephone Encounter (Signed)
-----   Message from Josephine Igo, DO sent at 12/18/2019  9:33 AM EDT ----- I looked at the images I completely agree. Too high risk for bronch. Bad lung disease  Also, lesion is not very solid I would follow up with repeat Ct and if growing would consider radiation Or just palliation alone Brad  ----- Message ----- From: Glenford Bayley, NP Sent: 12/18/2019   9:02 AM EDT To: Kalman Shan, MD, Josephine Igo, DO  Dr. Tonia Brooms, this patient has advanced ILD. He has  LUL nodules with SUV uptake 9 with two additional areas of uptake in RUL AND RLL. He is on 3-5 L pulsed oxygen. Not certain he would be the appropriate candidate for bronchoscopy. Can you look at this imaging and we can talk next week to see if this would be appropriate for him

## 2019-12-25 ENCOUNTER — Other Ambulatory Visit: Payer: Self-pay

## 2019-12-25 ENCOUNTER — Other Ambulatory Visit (HOSPITAL_COMMUNITY)
Admission: RE | Admit: 2019-12-25 | Discharge: 2019-12-25 | Disposition: A | Discharge status: Home / Self Care | Payer: Medicare Other | Source: Ambulatory Visit | Attending: Internal Medicine | Admitting: Internal Medicine

## 2019-12-25 DIAGNOSIS — Z01812 Encounter for preprocedural laboratory examination: Secondary | ICD-10-CM | POA: Insufficient documentation

## 2019-12-25 DIAGNOSIS — Z20822 Contact with and (suspected) exposure to covid-19: Secondary | ICD-10-CM | POA: Insufficient documentation

## 2019-12-26 LAB — SARS CORONAVIRUS 2 (TAT 6-24 HRS): SARS Coronavirus 2: NEGATIVE

## 2019-12-29 ENCOUNTER — Other Ambulatory Visit: Payer: Self-pay

## 2019-12-29 ENCOUNTER — Ambulatory Visit (HOSPITAL_COMMUNITY)
Admission: RE | Admit: 2019-12-29 | Discharge: 2019-12-29 | Disposition: A | Discharge status: Home / Self Care | Payer: Medicare Other | Source: Ambulatory Visit | Attending: Internal Medicine | Admitting: Internal Medicine

## 2019-12-29 DIAGNOSIS — J849 Interstitial pulmonary disease, unspecified: Secondary | ICD-10-CM | POA: Insufficient documentation

## 2019-12-29 LAB — PULMONARY FUNCTION TEST
DL/VA % pred: 20 %
DL/VA: 0.83 ml/min/mmHg/L
DLCO unc % pred: 5 %
DLCO unc: 1.44 ml/min/mmHg
FEF 25-75 Post: 0.23 L/sec
FEF 25-75 Pre: 1.26 L/sec
FEF2575-%Change-Post: -81 %
FEF2575-%Pred-Post: 9 %
FEF2575-%Pred-Pre: 55 %
FEV1-%Change-Post: -36 %
FEV1-%Pred-Post: 28 %
FEV1-%Pred-Pre: 45 %
FEV1-Post: 0.9 L
FEV1-Pre: 1.41 L
FEV1FVC-%Change-Post: -23 %
FEV1FVC-%Pred-Pre: 108 %
FEV6-%Change-Post: -17 %
FEV6-%Pred-Post: 36 %
FEV6-%Pred-Pre: 44 %
FEV6-Post: 1.47 L
FEV6-Pre: 1.79 L
FEV6FVC-%Change-Post: -1 %
FEV6FVC-%Pred-Post: 105 %
FEV6FVC-%Pred-Pre: 106 %
FVC-%Change-Post: -16 %
FVC-%Pred-Post: 34 %
FVC-%Pred-Pre: 41 %
FVC-Post: 1.49 L
FVC-Pre: 1.79 L
Post FEV1/FVC ratio: 61 %
Post FEV6/FVC ratio: 99 %
Pre FEV1/FVC ratio: 79 %
Pre FEV6/FVC Ratio: 100 %
RV % pred: 104 %
RV: 2.7 L
TLC % pred: 58 %
TLC: 4.18 L

## 2019-12-29 MED ORDER — ALBUTEROL SULFATE (2.5 MG/3ML) 0.083% IN NEBU
2.5000 mg | INHALATION_SOLUTION | Freq: Once | RESPIRATORY_TRACT | Status: AC
  Administered 2019-12-29: 2.5 mg via RESPIRATORY_TRACT

## 2020-01-07 NOTE — Telephone Encounter (Signed)
Order has been placed. Nothing further needed. 

## 2020-01-12 DIAGNOSIS — I10 Essential (primary) hypertension: Secondary | ICD-10-CM | POA: Diagnosis not present

## 2020-01-12 DIAGNOSIS — R7303 Prediabetes: Secondary | ICD-10-CM | POA: Diagnosis not present

## 2020-01-12 DIAGNOSIS — E782 Mixed hyperlipidemia: Secondary | ICD-10-CM | POA: Diagnosis not present

## 2020-01-19 ENCOUNTER — Other Ambulatory Visit: Payer: Self-pay

## 2020-01-19 ENCOUNTER — Ambulatory Visit (INDEPENDENT_AMBULATORY_CARE_PROVIDER_SITE_OTHER): Payer: Medicare Other | Admitting: Internal Medicine

## 2020-01-19 VITALS — BP 132/68 | Temp 97.9°F | Ht 71.0 in | Wt 197.8 lb

## 2020-01-19 DIAGNOSIS — J849 Interstitial pulmonary disease, unspecified: Secondary | ICD-10-CM

## 2020-01-19 DIAGNOSIS — J9611 Chronic respiratory failure with hypoxia: Secondary | ICD-10-CM | POA: Diagnosis not present

## 2020-01-19 DIAGNOSIS — Z7189 Other specified counseling: Secondary | ICD-10-CM | POA: Diagnosis not present

## 2020-01-19 DIAGNOSIS — R911 Solitary pulmonary nodule: Secondary | ICD-10-CM | POA: Diagnosis not present

## 2020-01-19 NOTE — Patient Instructions (Addendum)
Interstitial lung disease due to connective tissue disease (HCC) History of asbestos exposure Chronic respiratory failure with hypoxia (HCC)   -Reason for pulmonary fibrosis is the rheumatoid arthritis with other issues such as asbestos, smoking and prior methotrexate may be contributing along with previous occupations - Disease is now slolwy progressive - in office 01/19/2020  - 6L [place] - 89% at rest   PLAN  -Continue oxygen -goal pulse ox is at least greater than 85% -he may need 8-10 L with exertion -Have the DME company come to your house and titrate oxygen to a higher level -you need continuous oxygen -Continue nintedanib through [place] -Continue Rituxan through rheumatology -Referral home palliative care -to do goals of care.  I strongly advised no intubation and no CPR -You do qualify for home hospice -and this is a consideration to have a discussion with the hospice company [this would mean stopping nintedanib and Rituxan at this point] -Check arterial blood gas and if the carbon dioxide is high we can consider home BiPAP -Check liver function test and chemistry and CBC today in our office is a lot of things that are DME company referral home palliative care blood gas - Stay behind and do ILD - PRO registry visit today   Pulmonary venous hypertension  - due to above  - clinically well controlled with lasix   Plan - continue lasix as before   Left upper lobe nodule 2.7 cm August 04, 2018 ->  May 2020 on CT chest  -> stble Nov 2020 -> increased to 5.8 cm May 2021 and PET HOT SUV 9.0   -This is very likely lung cancer -Too high risk for lung biopsy or surgery  Plan -Refer to radiation oncology in [place] or [place] for consideration of empiric radiation but keep in mind that radiation is a small potential to make rest of the scar tissue worse  Goals of care  -Strongly advise getting all your affairs in order in discussion with the family -Strongly advise filling out MOST  form and make yourself no intubation and no CPR -Strongly advise home palliative care   -make a referral and you can discuss with them about home palliation versus home hospice and also goals of care and improving symptoms  Follow-up - return in 4-8 weeks to see nurse practitioner to continue goals of care discussion

## 2020-01-19 NOTE — Research (Signed)
Title: Chronic Fibrosing Interstitial Lung Disease with Progressive Phenotype Prospective Outcomes (ILD-PRO) Registry   Protocol #: [id number], Clinical Trials # [id number], Sponsor: [employer]  Protocol Version Amendment 4 dated 12Sep2019  and confirmed current on 01/19/2020 Consent Version for today's visit date of 01/19/2020  is Version Amendment 4 (05Dec2019)   Clinical Research Coordinator / Research RN note : This visit for Subject [name] with DOB: [id number] on 01/19/2020 for the above protocol is Visit/Encounter # 6 Month Follow-up  and is for purpose of research. The consent for this encounter is under Protocol Version Amendment 4 (12Sep2019) and is currently IRB approved. Subject expressed continued interest and consent in continuing as a study subject. Subject confirmed that there was no change in contact information (e.g. address, telephone, email). Subject thanked for participation in research and contribution to science.   In this visit 01/19/2020 the subject completed the blood work and questionnaires per the above referenced protocol. Please refer to the subject's paper source binder for further details.   Signed by  Otilio Connors  Research Assistant [facility]  [place], [place] 6:01 PM 01/19/2020

## 2020-01-19 NOTE — Addendum Note (Signed)
Addended by: Dorisann Frames R on: 01/19/2020 04:41 PM   Modules accepted: Orders

## 2020-01-19 NOTE — Progress Notes (Signed)
[name] presents for interstitial lung disease evaluation.  He is being taken care of by a pulmonologist Dr [name] in [place], [place] but he relocated to [place] and patient is now without a pulmonologist.  Therefore he is been referred to the ILD center here  IOV 07/22/2018  [place] Integrated Comprehensive ILD Questionnaire  Symptoms: As best as I can gather in 2008 he had a right necrotizing pneumonia with a chest tube as evidenced by my review of the chart and my visualization of the CT scan.  He does not recollect this much.  He says his been diagnosed with interstitial lung disease starting spring 2019 [prior to that for many years he was just on nighttime oxygen for sleep apnea] and then starting May 2019 he has been on nintedanib.  Sometime around this time he also diagnosed with rheumatoid arthritis.  He says he took a few days of methotrexate but this made his breathing worse.  [name] the nurse practitioner at [facility] rheumatology is now considering Remicade but this is not been started yet.  He is currently on daily prednisone 5 mg/day but I do not know if this is for lung disease or for rheumatoid arthritis.  At this point in time he is on 3 L of oxygen continuously.  Today when we turned his oxygen off on room air at rest for 20 minutes his pulse ox was 85% on room air with a finger and forehead probe.  In terms of his own history he says he has had insidious onset of shortness of breath for the last 76 years.  It is been progressive for the last 1 year.  Present on exertion relieved by rest.  When he takes a shower or dress this is a level 2 dyspnea.  When he does household work or shopping it is level 3.  When walking up stairs or walking up a hill it is level 1 but this is with oxygen.  He only has cough occasionally.  And occasionally he is wheezy.   Past Medical History : Diagnosis rheumatoid arthritis for few years although at one point in time he told me it  only for the last 1 year.  Denies any other autoimmune disease.  Did not answer anything about diabetes or acid reflux.  Noticed to be on fish oil.  Denied heart disease.  He did see Dr. Haroldine [name] in cardiology in September 2019.  Echocardiogram shows severe pulmonary artery systolic pressure elevation to 80 mmHg.   ROS: Significant for joint pain for few years.   FAMILY HISTORY of LUNG DISEASE: Denies any family history of pulmonary fibrosis   EXPOSURE HISTORY: Smokes cigarettes since his teenage years.  He states he quit smoking in 2005 smoking 2 to 3 packs/day.  Although today he did smell of tobacco suspect ongoing smoking.  Denies any marijuana or electronic cigarettes or vaping.  No intravenous drug use.   HOME and HOBBY DETAILS : Lives in an apartment for 8 months.  No advance living environment.  No mold or mildew in the house.  No humidifier use.  No CPAP use.  [Is not able to tolerate CPAP/BiPAP] no nebulizer use.  No steam iron.  No Jacuzzi use.  No fountain inside the house.  No pet birds or parakeets.  No hamsters.  No feather pillow use.  No feather blanket use.  No mold in the [facility] duct.  No guarding habits no music habits.   OCCUPATIONAL HISTORY (122  questions) : He did have asbestos exposure in the past working in [employer].  He did go tobacco as a young adult.  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  During this time was exposed to dusty environment   PULMONARY TOXICITY HISTORY (27 items): In 2019 for a few days took methotrexate according to his history but this made his dyspnea worse.  Has been on prednisone for 2 years.  Serial pulmonary function testing listed below.    Results for David, [name] (MRN [id number]) as of 07/22/2018 14:56  Ref. Range 11/01/27 - [place] at time of ofev 03/20/2018 11:28  06/19/18 [place] per notes  FVC-Pre Latest Units: L 1.75L 1.61 1.77L/  FVC-%Pred-Pre Latest Units: %  36 47%  FEV1-Pre Latest Units: L  1.32   FEV1-%Pred-Pre Latest Units: %  40   Pre FEV1/FVC ratio Latest Units: %  82   Results for David, [name] (MRN [id number]) as of 07/22/2018 14:56  Ref. Range 03/20/2018 11:28  DLCO unc  Latest Units: ml/min/mmHg 6.59  DLCO unc % pred Latest Units: % 19    ECHO 03/20/18 Pulmonary arteries: Systolic pressure was severely increased. PA   peak pressure: 81 mm Hg (S). - Impressions: There is moderate to severe pulmonary HTN with RV   strain.  Impression  - There is moderate to severe pulmonary HTN with RV strain.      OV 08/21/2018  Subjective:  [name], male , DOB: 07-17-1945 , age 74 y.o. , MRN: [id number] , ADDRESS: [address] [place] [address]   08/21/2018 -   Chief Complaint  Patient presents with  . Follow-up    [name] and is still having problems with breathing when he exerts himself. Pt also has an occ dry cough and has had some mild CP.  DME: [employer], 4 pulse with exertion and 4 continuous at home.     HPI [name] 74 y.o. -presents to ILD clinic for follow-up.  He presents with his wife  The main question is the etiology of his interstitial lung disease.  He has a history of rheumatoid arthritis, occupational exposures and age and male gender can put him at risk for UIP/IPF.  In addition in the past he has had methotrexate previous smoker.  Therefore we will subjected him to a work-up.  His previous pulmonologist had already started him on nintedanib.  He had a high-resolution CT chest that is classic UIP.   The next main question was 1 of treatment.  He is currently on nintedanib.  In the interim his rheumatology PA [name] called me.  She reported the patient has significant pain from rheumatoid arthritis and felt that CellCept or Imuran  would not control the pain.  She is referred Remicade and she would adjust the prednisone.  I supported the plan.  Patient states he only has mild to moderate stiffness at this point of his joints.   The next issue is significant symptomatology of dyspnea.  The dyspnea scale is listed below.  This is quite disabling.  Therefore we did a blood gas to look for hypercapnia in case he needed nighttime BiPAP but there is no carbon dioxide retention on the blood gas below.  In addition I corresponded with Dr. Haroldine [name] and he did a right heart catheterization and there is only mild pulmonary venous hypertension.  Patient has never been to rehabilitation and is willing to go  He has a new issue of left upper lobe nodular nodule 2.7 cm.  Radiologist recommending CT chest in 3 months.  New complaint is that the oxygen tubing is causing abrasion of both ear on the pinna bilaterally.  He applies Neosporin sporadically.         IMPRESSION: 1. Spectrum of findings compatible with severe fibrotic interstitial lung disease with advanced honeycombing. Although the distribution is slightly atypical, there are no disqualifying features such as air trapping or nodularity, and the findings are considered most compatible with usual interstitial pneumonia (UIP). Findings are consistent with UIP per consensus guidelines: Diagnosis of Idiopathic Pulmonary Fibrosis: An Official ATS/ERS/JRS/ALAT Clinical Practice Guideline. [place], Iss 5, [phone], Mar 02 2017. 2. Mild mediastinal and bilateral hilar adenopathy, nonspecific, most likely reactive. 3. Poorly marginated nodular 2.7 cm focus of consolidation in the left upper lobe is nonspecific, favor focal fibrotic change, cannot exclude neoplasm. Suggest attention on follow-up chest CT in 3 months. 4. Two-vessel coronary atherosclerosis.  Aortic Atherosclerosis (ICD10-I70.0) and Emphysema (ICD10-J43.9).   Electronically  Signed   By: Ilona Sorrel M.D.   On: 08/04/2018 10:28   Results for KENSTON, LONGTON (MRN [id number]) as of 08/21/2018 10:44  Ref. Range 08/01/2018 12:44  pH, Arterial Latest Ref Range: 7.350 - 7.450  7.439  pCO2 arterial Latest Ref Range: 32.0 - 48.0 mmHg 42.6  pO2, Arterial Latest Ref Range: 83.0 - 108.0 mmHg 50.8 (L)    Right heart cath 08/08/2018 Findings:  RA = 7 RV = 49/8 PA = 52/17 (31) PCW = 22 Fick cardiac output/index = 5.5/2.5 PVR = 1.9 WU Ao sat = 94% PA sat = 67%, 67%  Assessment:  1. Mild pulmonary venous HTN  Plan/Discussion:  PA pressures only mildly elevated and mostly due to pulmonary venous HTN. Not candidate for selective pulmonary artery vasodilators.   Glori Bickers, MD   ROS - per HPI   OV 03/04/2019  Subjective:  [name], male , DOB: Jul 21, 1945 , age 66 y.o. , MRN: [id number] , ADDRESS: [address] [place] [address]   03/04/2019 -   Chief Complaint  Patient presents with  . Interstitial lung disease due to connective tissue disease    Feels like his breathing is worse since last visit. Gets short of breath after walking 30 feet or less.   Follow-up progressive interstitial lung disease UIP pattern secondary to rheumatoid arthritis and also in the setting of previous asbestos exposure and methotrexate therapy.  On nintedanib. On Remicade for rheumatoid arthritis  Follow-up left upper lobe 2.7 cm nodule February 2020  -unchanged May 2020.  HPI [name] 74 y.o. -presents with his wife for the above problems.  He has progressive worsening of dyspnea as documented below on his dyspnea score.  He is using 5 L of oxygen at rest.  He uses a 3 L portable oxygen but states that he has class III dyspnea that is severe.  He feels he might be desaturating although he has not tested himself for that.  He is in need of a flu shot.  He has been taking nintedanib for several months.  He says he is not had any  single side effect from it.  He says  he is compliant with it.  For his rheumatoid arthritis his rheumatologist now has him on Remicade and his wife and he confirmed that he is taking and getting infusions on a regular basis.  He is really concerned about his progressive dyspnea.  And a CT scan of the chest in May 2020 for the nodule regression of fibrosis was not reported.  His echocardiogram in 2019 shows good ejection fraction although he has diastolic dysfunction.   Walked him 185 feet x 3 laps on 3L [place] portable - did only 1 lap and desaturated to 83%. Needed 5L [place] to correct       CT chest May 2020  IMPRESSION: 1. Stable 2.7 cm ill-defined nodular focus of consolidation in left upper lobe, with associated bronchiectatic air bronchograms. This likely represents an area of confluent fibrosis, with neoplasm considered much less likely. Continued followup by chest CT without contrast recommended in 6 months. 2. Stable mild mediastinal and bilateral hilar lymphadenopathy, likely reactive in etiology. 3. Stable chronic pulmonary interstitial fibrosis with advanced honeycombing.  Aortic Atherosclerosis (ICD10-I70.0). Coronary artery atherosclerosis.   Electronically Signed   By: Earle Gell M.D.   On: 11/19/2018 13:44  OV 05/21/2019  Subjective:  [name], male , DOB: 02-09-1946 , age 77 y.o. , MRN: [id number] , ADDRESS: [address] [place] [address]  Follow-up progressive interstitial lung disease UIP pattern secondary to rheumatoid arthritis and also in the setting of previous asbestos exposure and methotrexate therapy.  On nintedanib. On Remicade for rheumatoid arthritis  Pulmonary vernous hypertension feb 2020  -  Right heart cath 08/08/2018 Findings: PA = 52/17 (31) PCW = 22 Fick cardiac output/index = 5.5/2.5 PVR = 1.9 WU    Follow-up left upper lobe 2.7 cm nodule February 2020  -unchanged May 2020.and Nov 2020    05/21/2019 -    Chief Complaint  Patient presents with  . Follow-up    PFT performed today. Pt states he has not been doing well since last visit. States his breathing has become worse. Pt is on 3L O2 at rest and 5L with exertion. Pt denies any complaints of cough or chest tightness.     HPI [name] 74 y.o. -last seen September 2020 for the above issues.  Presents with his wife.  He lives in [place].  After the visit in September 2020 he saw his rheumatology PA [name].  She is worried about worsening interstitial lung disease because of worsening shortness of breath.  He and his wife tell me that his shortness of breath is significantly worsened.  This is reflected in the symptom scores below.  However his oxygen need appears to be the same.  He had pulmonary function test because of the concern of worsening ILD.  The pulmonary function test is actually better than a year ago.  He had a high-resolution CT chest 3 days ago.  I personally visualized the CT.  The report is below.  There is no comment about progression from Dr. Lorin Picket but in my personal visualization I do not think there is any progression.  I have written to Dr. Rosario Jacks to make a comment about progression or not.  Nevertheless he is having progressive shortness of breath.  He is sedentary.  He is overweight.  They are not been able to attend rehab because of the pandemic.  He is asking for albuterol nebulizer refill.    OV 01/19/2020   Subjective:  Patient ID:  [name], male , DOB: [birth year], age 65 y.o. years. , MRN: [id number],  ADDRESS: [address] [place] [address] PCP  Brand Males, MD Providers : Treatment Team:  Attending Provider: Brand Males, MD  #ILD - Chronic resp failure Follow-up progressive interstitial lung disease UIP pattern secondary to rheumatoid arthritis and also in the setting of previous asbestos exposure and methotrexate therapy.  On nintedanib. On Remicade for  rheumatoid arthritis  Pulmonary vernous hypertension feb 2020  -  Right heart cath 08/08/2018 Findings: PA = 52/17 (31) okay PCW = 22 Fick cardiac output/index = 5.5/2.5 PVR = 1.9 WU   # Follow-up left upper lobe 2.7 cm nodule February 2020  -unchanged May 2020.and Nov 2020 -> increased to greater than 5 cm in May 2021 and PET active.  Too high risk for biopsy Chief Complaint  Patient presents with  . Follow-up    hard breathing        HPI [name] 74 y.o. -presents with his wife for follow-up.  He has class IV dyspnea by now.  He is on 5 L pulsed at home..  It took several minutes for him to get his pulse ox to greater than 89%.  But it never got over 90% when he came to the office.  Is on continuous 6 L.  He continues his Rituxan per rheumatology.  He is continues his nintedanib through [place].  He is tolerating this well.  In the interim he is picked up enlargement of his left upper lobe nodule.  This is PET active.  He saw the nurse practitioner who communicated with our pulmonary oncologist Dr. June Leap.  Patient felt to be too high risk for biopsy.  He tells me that his main problem is significant shortness of breath.  He wants relief from dyspnea.  He and his wife are not sure about the rest of the goals of care.  Currently is continuing active treatment currently is full code.  They have never met with palliative care or hospice in the past.   SYMPTOM SCALE - ILD 08/21/2018  03/04/2019  05/21/2019   O2 use 4LNC at rest 3 L [place] portable + 5L [place] at night rest 3 to 5  Shortness of Breath 0 -> 5 scale with 5 being worst (score 6 If unable to do)    At rest 1 0 2  Simple tasks - showers, clothes change, eating, shaving '4 5 5  ' Household (dishes, doing bed, laundry) '3 5 5  ' Shopping '3 5 6  ' Walking level at own pace '3 5 6  ' Walking keeping up with others of same age '4 5 6  ' Walking up Stairs '3 5 6  ' Walking up [place] '3 5 6  ' Total (40 - 48) Dyspnea Score 24 35 42  How bad is  your cough? x    How bad is your fatigue x       xxxxxxxxxxxxxxxxxxxxxxxxxxxxxxxxxxxxxxxxxxxxxxxxxxxxxxxxxxxxxxxxxxxxxxxxxxxxxxxx  PFT  Results for NASRI, BOAKYE (MRN [id number]) as of 01/19/2020 15:22  Ref. Range 03/20/2018 11:28 05/21/2019 08:54 08/25/2019 15:00 12/29/2019 13:02  FVC-Pre Latest Units: L 1.61 2.04 1.57 1.79  FVC-%Pred-Pre Latest Units: % 36 47 36 41   Results for ELISAH, PARMER (MRN [id number]) as of 01/19/2020 15:22  Ref. Range 03/20/2018 11:28 05/21/2019 08:54 08/25/2019 15:00 12/29/2019 13:02  DLCO unc Latest Units: ml/min/mmHg 6.59 11.64 6.53 1.44  DLCO unc % pred Latest Units: % 19 45 25 5    xxxxxxxxxxxxxxxxxxxxxxxxxxxxxxxxxxxxxxxxxxxxxxxxxxxxxxxxxxxxxxxxxx  CT chest high reslution   IMPRESSION: 1. Redemonstrated severe pulmonary fibrosis in an apical predominant pattern with relative sparing of the bilateral lung bases, featuring very extensive traction bronchiectasis, bronchiolectasis, and severe honeycombing. No significant air trapping on expiratory phase imaging. Although distribution is unusual, constellation of findings is most consistent with UIP pattern fibrosis. Findings are consistent with UIP per consensus guidelines: Diagnosis of Idiopathic Pulmonary Fibrosis: An Official ATS/ERS/JRS/ALAT Clinical Practice Guideline. [place], Iss 5, [phone], Mar 02 2017. 2. There is an interval increase in an area of ill-defined consolidation with internal air bronchograms in the central left upper lobe, measuring approximately 5.8 x 3.2 cm, previously approximately 4.5 x 1.8 cm when measured similarly, likely an area of dense, confluent fibrosis and/or massive fibrosis. Malignancy is again not strictly excluded. This could be characterized for metabolic activity by FDG PET if desired. 3. Underlying fibrosis is otherwise not perceptibly changed in the immediate interval although very substantially worsened in comparison to  remote prior CT dated 2008. 4. Redemonstrated enlarged mediastinal hilar lymph nodes, likely reactive in the setting of fibrosis. 5. Cardiomegaly and trace pericardial effusion. 6. Coronary artery disease.  Aortic Atherosclerosis (ICD10-I70.0).   Electronically Signed   By: Eddie Candle M.D.   On: 11/18/2019 11:44  xxxxxxxxxxxxxxxxxxxxxxxxxxxxxxxxxxxxxxxxxxxxxxxxxxxxxxxxxxxxxxxxx  PET Scan 11/24/19  IMPRESSION: 1. There is moderate to marked increased FDG uptake corresponding to the recently described progressive, confluent area of increased consolidation in the left upper lobe with SUV max of 9.09. In the setting of severe pulmonary fibrosis this finding is somewhat nonspecific and may represent acute on chronic inflammatory change. Underlying malignancy is not excluded. 2. Within the right upper lobe and right lower lobe there are 2 additional areas of increased radiotracer uptake above background lung activity with corresponding small nodular foci on the CT images. This is also a nonspecific finding (in the setting of advanced pulmonary fibrosis). Small foci of FDG avid malignancy not excluded. 3. Prominent, non pathologically enlarged mediastinal and hilar lymph nodes are identified which exhibits mildly increased FDG uptake, nonspecific in the setting of advanced pulmonary fibrosis. 4.  Aortic Atherosclerosis (ICD10-I70.0).   Electronically Signed   By: Kerby Moors M.D.   On: 11/24/2019 09:16  ROS - per HPI     has a past medical history of Cataracts, bilateral, COPD (chronic obstructive pulmonary disease) ([place]), Hearing loss, Macular degeneration, and Rheumatoid arthritis ([place]).   reports that he quit smoking about 16 years ago. His smoking use included cigarettes. He has a 75.00 pack-year smoking history. He has never used smokeless tobacco.  Past Surgical History:  Procedure Laterality Date  . LUNG BIOPSY    . RIGHT HEART CATH N/A 08/08/2018    Procedure: RIGHT HEART CATH;  Surgeon: Jolaine Artist, MD;  Location: [facility];  Service: Cardiovascular;  Laterality: N/A;    No Known Allergies  Immunization History  Administered Date(s) Administered  . Fluad Quad(high Dose 65+) 03/04/2019  . Influenza, High Dose Seasonal PF 08/21/2018  . Influenza-Unspecified 04/05/2019  . Moderna SARS-COVID-2 Vaccination 09/01/2019, 10/02/2019  . Pneumococcal Conjugate-13 02/20/2013  . Pneumococcal Polysaccharide-23 03/26/2014    No family history on file.   Current Outpatient Medications:  .  albuterol (PROVENTIL) (2.5 MG/3ML) 0.083% nebulizer solution, Take 3 mLs (2.5 mg total) by nebulization every 6 (six) hours as needed for wheezing or shortness of breath., Disp: 150 mL, Rfl: 3 .  Artificial Tear Ointment (LUBRICANT EYE OP), Place 1-2 drops  into both eyes daily. Gel Drops, Disp: , Rfl:  .  furosemide (LASIX) 20 MG tablet, Take 20 mg by mouth daily as needed for edema. , Disp: , Rfl: 1 .  ibuprofen (ADVIL,MOTRIN) 200 MG tablet, Take 600 mg by mouth every 6 (six) hours as needed., Disp: , Rfl:  .  mirtazapine (REMERON) 30 MG tablet, Take 30 mg by mouth at bedtime. , Disp: , Rfl:  .  mometasone-formoterol (DULERA) 100-5 MCG/ACT AERO, Inhale 2 puffs into the lungs 2 (two) times daily., Disp: , Rfl:  .  Multiple Vitamins-Minerals (PRESERVISION AREDS 2+MULTI VIT) CAPS, Take 1 capsule by mouth 2 (two) times daily., Disp: , Rfl:  .  Nintedanib (OFEV) 150 MG CAPS, Take 150 mg by mouth 2 (two) times daily., Disp: , Rfl:  .  nystatin cream (MYCOSTATIN), Apply 1 application topically 2 (two) times daily., Disp: 30 g, Rfl: 3 .  Omega-3 Fatty Acids (FISH OIL) 1200 MG CAPS, Take 1,200 mg by mouth 2 (two) times daily. , Disp: , Rfl:  .  predniSONE (DELTASONE) 10 MG tablet, Take 10 mg by mouth 2 (two) times daily with a meal. , Disp: , Rfl:  .  Turmeric 500 MG CAPS, Take 500 mg by mouth daily. , Disp: , Rfl:  .  sulfamethoxazole-trimethoprim  (BACTRIM DS) 800-160 MG tablet, , Disp: , Rfl:       Objective:   Vitals:   01/19/20 1524 01/19/20 1526  BP: 132/68   Temp: 97.9 F (36.6 C)   TempSrc: Oral   SpO2:  (!) 81%  Weight: 197 lb 12.8 oz (89.7 kg)   Height: '5\' 11"'  (1.803 m)     Estimated body mass index is 27.59 kg/m as calculated from the following:   Height as of this encounter: '5\' 11"'  (1.803 m).   Weight as of this encounter: 197 lb 12.8 oz (89.7 kg).  '@WEIGHTCHANGE' @  [employer]   01/19/20 1524  Weight: 197 lb 12.8 oz (89.7 kg)     Physical Exam Sitting in the wheelchair.  Dyspneic talking.  Pulse ox 88% on 6 L nasal cannula continuous.  Significant bilateral diffuse crackles present.  No evidence of cor pulmonale.     Assessment:       ICD-10-CM   1. Interstitial pulmonary disease ([place])  J84.9   2. Chronic respiratory failure with hypoxia (H[facility])  J96.11   3. Solitary pulmonary nodule  R91.1   4. Goals of care, counseling/discussion  Z71.89    Discussed medicare hospice benefit  - a medicare paid benefit  - for people with terminal qualifying  illness such as IPF, COPD, cancer with statistical prognosis < 6 months for which there is no cure - utilization of hospice shows people live longer paradoxically than those without hospice due to improved attention  - explained hospice locations - home, residential etc.,   - explained respite care options for caregivers  - explained that she could still get treatment for non-hospice diagnosis and still come to office to see me for hospice related diagnosis that i provide support for  - explained that hospice provides nursing, MD, chaplain, volunteer and medications and supplies paid through medicare   At this point in time I strongly advised no intubation and no CPR.  They seem agreeable for this.  Advise symptom relief for home palliative care and during this conversation decide on inflection point between going to home hospice versus continuing with current  active care and also getting concurrent palliative care.  I did indicate that it is unpredictable but his prognosis is limited.  They do at this point in time want to meet with radiation oncologist.  He also wants to consider BiPAP.  There probably come to a conclusion on goals of care after these evaluations.     Plan:     Patient Instructions  Interstitial lung disease due to connective tissue disease ([place]) History of asbestos exposure Chronic respiratory failure with hypoxia ([place])   -Reason for pulmonary fibrosis is the rheumatoid arthritis with other issues such as asbestos, smoking and prior methotrexate may be contributing along with previous occupations - Disease is now slolwy progressive - in office 01/19/2020  - 6L [place] - 89% at rest   PLAN  -Continue oxygen -goal pulse ox is at least greater than 85% -he may need 8-10 L with exertion -Have the DME company come to your house and titrate oxygen to a higher level -Continue nintedanib through [place] -Continue Rituxan through rheumatology -Referral home palliative care -to do goals of care.  I strongly advised no intubation and no CPR -You do qualify for home hospice -and this is a consideration to have a discussion with the hospice company [this would mean stopping nintedanib and Rituxan at this point] -Check arterial blood gas and if the carbon dioxide is high we can consider home BiPAP -Check liver function test and chemistry and CBC today in our office - Stay behind and do ILD - PRO registry visit today   Pulmonary venous hypertension  - due to above  - clinically well controlled with lasix   Plan - continue lasix as before   Left upper lobe nodule 2.7 cm August 04, 2018 ->  May 2020 on CT chest  -> stble Nov 2020 -> increased to 5.8 cm May 2021 and PET HOT SUV 9.0   -This is very likely lung cancer -Too high risk for lung biopsy or surgery  Plan -Refer to radiation oncology in [place] or [place] for consideration  of empiric radiation but keep in mind that radiation is a small potential to make rest of the scar tissue worse  Goals of care  -Strongly advise getting all your affairs in order in discussion with the family -Strongly advise filling out MOST form and make yourself no intubation and no CPR -Strongly advise home palliative care   -make a referral and you can discuss with them about home palliation versus home hospice and also goals of care and improving symptoms  Follow-up - return in 4-8 weeks to see nurse practitioner to continue goals of care discussion    ( Level 05 visit: Estb 40-54 min   in  visit type: on-site physical face to visit  in total care time and counseling or/and coordination of care by this undersigned MD - Dr Brand Males. This includes one or more of the following on this same day 01/19/2020: pre-charting, chart review, note writing, documentation discussion of test results, diagnostic or treatment recommendations, prognosis, risks and benefits of management options, instructions, education, compliance or risk-factor reduction. It excludes time spent by the [place] or office staff in the care of the patient. Actual time 40 min)    SIGNATURE    Dr. Brand Males, M.D., F.C.C.P,  Pulmonary and Critical Care Medicine Staff Physician, [place] Director - Interstitial Lung Disease  Program  [facility] [place], [place], [address]  Pager: [phone], If no answer or between  15:00h -  7:00h: call [phone] Telephone: [phone]  3:52 PM 01/19/2020

## 2020-01-20 ENCOUNTER — Telehealth: Payer: Self-pay | Admitting: Internal Medicine

## 2020-01-20 ENCOUNTER — Encounter: Payer: Self-pay | Admitting: *Deleted

## 2020-01-20 DIAGNOSIS — R05 Cough: Secondary | ICD-10-CM | POA: Diagnosis not present

## 2020-01-20 DIAGNOSIS — E782 Mixed hyperlipidemia: Secondary | ICD-10-CM | POA: Diagnosis not present

## 2020-01-20 DIAGNOSIS — I10 Essential (primary) hypertension: Secondary | ICD-10-CM | POA: Diagnosis not present

## 2020-01-20 DIAGNOSIS — R918 Other nonspecific abnormal finding of lung field: Secondary | ICD-10-CM

## 2020-01-20 DIAGNOSIS — R7309 Other abnormal glucose: Secondary | ICD-10-CM | POA: Diagnosis not present

## 2020-01-20 LAB — BASIC METABOLIC PANEL
BUN: 16 mg/dL (ref 6–23)
CO2: 35 mEq/L — ABNORMAL HIGH (ref 19–32)
Calcium: 9.4 mg/dL (ref 8.4–10.5)
Chloride: 99 mEq/L (ref 96–112)
Creatinine, Ser: 0.79 mg/dL (ref 0.40–1.50)
GFR: 95.42 mL/min (ref >60.00)
Glucose, Bld: 107 mg/dL — ABNORMAL HIGH (ref 70–99)
Potassium: 4.9 mEq/L (ref 3.5–5.1)
Sodium: 138 mEq/L (ref 135–145)

## 2020-01-20 LAB — CBC
HCT: 47.5 % (ref 39.0–52.0)
Hemoglobin: 15.3 g/dL (ref 13.0–17.0)
MCHC: 32.2 g/dL (ref 30.0–36.0)
MCV: 94.4 fl (ref 78.0–100.0)
Platelets: 225 10*3/uL (ref 150.0–400.0)
RBC: 5.03 Mil/uL (ref 4.22–5.81)
RDW: 15.6 % — ABNORMAL HIGH (ref 11.5–15.5)
WBC: 11.3 10*3/uL — ABNORMAL HIGH (ref 4.0–10.5)

## 2020-01-20 LAB — ALT: ALT: 34 U/L (ref 0–53)

## 2020-01-20 NOTE — Telephone Encounter (Signed)
Forwarding to PCC pool.

## 2020-01-20 NOTE — Telephone Encounter (Signed)
Will call [facility] on 01/21/2020, it is after 5 pm.

## 2020-01-20 NOTE — Telephone Encounter (Signed)
Noted on order.  Will route to local hospice for pt.

## 2020-01-20 NOTE — Progress Notes (Signed)
I received referral on [name] today.  Patient needs to be seen with Rad Onc at this time.  I completed referral per Dr. Jane Canary note.  I updated new patient coordinator.

## 2020-01-21 NOTE — Telephone Encounter (Signed)
On 4/13, pt called office stating that he was wanting to discontinue his O2 with [facility] and stick with [facility] for his O2. Pt had turned all of his O2 equipment back to [facility].  Is the DME order from 7/20 able to be sent to [facility] or could hospice take care of supplying pt with the O2 that he needs since he is being referred to palliative care?

## 2020-01-21 NOTE — Telephone Encounter (Signed)
Per [facility], they will provide the pt's O2.  I am faxing over referral for hospice care to them & they will take care of everything else.  Ph# [phone]/[phone], address:  [address], [place], [place]  [address]

## 2020-01-26 DIAGNOSIS — I272 Pulmonary hypertension, unspecified: Secondary | ICD-10-CM | POA: Diagnosis not present

## 2020-01-26 DIAGNOSIS — I700 Atherosclerosis of aorta: Secondary | ICD-10-CM | POA: Diagnosis not present

## 2020-01-26 DIAGNOSIS — J9611 Chronic respiratory failure with hypoxia: Secondary | ICD-10-CM | POA: Diagnosis not present

## 2020-01-26 DIAGNOSIS — R634 Abnormal weight loss: Secondary | ICD-10-CM | POA: Diagnosis not present

## 2020-01-26 DIAGNOSIS — J8410 Pulmonary fibrosis, unspecified: Secondary | ICD-10-CM | POA: Diagnosis not present

## 2020-01-26 DIAGNOSIS — R911 Solitary pulmonary nodule: Secondary | ICD-10-CM | POA: Diagnosis not present

## 2020-01-27 DIAGNOSIS — R634 Abnormal weight loss: Secondary | ICD-10-CM | POA: Diagnosis not present

## 2020-01-27 DIAGNOSIS — R911 Solitary pulmonary nodule: Secondary | ICD-10-CM | POA: Diagnosis not present

## 2020-01-27 DIAGNOSIS — I272 Pulmonary hypertension, unspecified: Secondary | ICD-10-CM | POA: Diagnosis not present

## 2020-01-27 DIAGNOSIS — I700 Atherosclerosis of aorta: Secondary | ICD-10-CM | POA: Diagnosis not present

## 2020-01-27 DIAGNOSIS — J8410 Pulmonary fibrosis, unspecified: Secondary | ICD-10-CM | POA: Diagnosis not present

## 2020-01-27 DIAGNOSIS — J9611 Chronic respiratory failure with hypoxia: Secondary | ICD-10-CM | POA: Diagnosis not present

## 2020-01-29 DIAGNOSIS — J9611 Chronic respiratory failure with hypoxia: Secondary | ICD-10-CM | POA: Diagnosis not present

## 2020-01-29 DIAGNOSIS — R911 Solitary pulmonary nodule: Secondary | ICD-10-CM | POA: Diagnosis not present

## 2020-01-29 DIAGNOSIS — I700 Atherosclerosis of aorta: Secondary | ICD-10-CM | POA: Diagnosis not present

## 2020-01-29 DIAGNOSIS — J8410 Pulmonary fibrosis, unspecified: Secondary | ICD-10-CM | POA: Diagnosis not present

## 2020-01-29 DIAGNOSIS — R634 Abnormal weight loss: Secondary | ICD-10-CM | POA: Diagnosis not present

## 2020-01-29 DIAGNOSIS — I272 Pulmonary hypertension, unspecified: Secondary | ICD-10-CM | POA: Diagnosis not present

## 2020-01-31 DIAGNOSIS — I700 Atherosclerosis of aorta: Secondary | ICD-10-CM | POA: Diagnosis not present

## 2020-01-31 DIAGNOSIS — J9611 Chronic respiratory failure with hypoxia: Secondary | ICD-10-CM | POA: Diagnosis not present

## 2020-01-31 DIAGNOSIS — R911 Solitary pulmonary nodule: Secondary | ICD-10-CM | POA: Diagnosis not present

## 2020-01-31 DIAGNOSIS — J8410 Pulmonary fibrosis, unspecified: Secondary | ICD-10-CM | POA: Diagnosis not present

## 2020-01-31 DIAGNOSIS — R634 Abnormal weight loss: Secondary | ICD-10-CM | POA: Diagnosis not present

## 2020-01-31 DIAGNOSIS — I272 Pulmonary hypertension, unspecified: Secondary | ICD-10-CM | POA: Diagnosis not present

## 2020-02-01 ENCOUNTER — Telehealth: Payer: Self-pay | Admitting: Internal Medicine

## 2020-02-01 NOTE — Telephone Encounter (Signed)
Called and spoke with pt's wife Thelma Barge who stated that pt is now under hospice care. She wants to know if this would cancel the study that pt is currently enrolled in with [facility]. MR, please advise.

## 2020-02-02 DIAGNOSIS — R634 Abnormal weight loss: Secondary | ICD-10-CM | POA: Diagnosis not present

## 2020-02-02 DIAGNOSIS — R911 Solitary pulmonary nodule: Secondary | ICD-10-CM | POA: Diagnosis not present

## 2020-02-02 DIAGNOSIS — J8410 Pulmonary fibrosis, unspecified: Secondary | ICD-10-CM | POA: Diagnosis not present

## 2020-02-02 DIAGNOSIS — I272 Pulmonary hypertension, unspecified: Secondary | ICD-10-CM | POA: Diagnosis not present

## 2020-02-02 DIAGNOSIS — J9611 Chronic respiratory failure with hypoxia: Secondary | ICD-10-CM | POA: Diagnosis not present

## 2020-02-02 DIAGNOSIS — I700 Atherosclerosis of aorta: Secondary | ICD-10-CM | POA: Diagnosis not present

## 2020-02-04 NOTE — Telephone Encounter (Signed)
If they cannot make it to study visits - they cannot make it. But study team will monitor on chart. I will let Otilio Connors from [facility] study team give the wife a call and answer questions regarding study. Thanks for taking the call

## 2020-02-05 ENCOUNTER — Telehealth: Payer: Self-pay

## 2020-02-05 DIAGNOSIS — I272 Pulmonary hypertension, unspecified: Secondary | ICD-10-CM | POA: Diagnosis not present

## 2020-02-05 DIAGNOSIS — J9611 Chronic respiratory failure with hypoxia: Secondary | ICD-10-CM | POA: Diagnosis not present

## 2020-02-05 DIAGNOSIS — I700 Atherosclerosis of aorta: Secondary | ICD-10-CM | POA: Diagnosis not present

## 2020-02-05 DIAGNOSIS — R911 Solitary pulmonary nodule: Secondary | ICD-10-CM | POA: Diagnosis not present

## 2020-02-05 DIAGNOSIS — R634 Abnormal weight loss: Secondary | ICD-10-CM | POA: Diagnosis not present

## 2020-02-05 DIAGNOSIS — J8410 Pulmonary fibrosis, unspecified: Secondary | ICD-10-CM | POA: Diagnosis not present

## 2020-02-05 NOTE — Telephone Encounter (Signed)
Attempted to call pt's wife to follow-up on research participation. Wife was unavailable but left message with wife's sister. Wife's sister stated she would return the call.

## 2020-02-08 DIAGNOSIS — H353114 Nonexudative age-related macular degeneration, right eye, advanced atrophic with subfoveal involvement: Secondary | ICD-10-CM | POA: Diagnosis not present

## 2020-02-09 DIAGNOSIS — R911 Solitary pulmonary nodule: Secondary | ICD-10-CM | POA: Diagnosis not present

## 2020-02-09 DIAGNOSIS — R634 Abnormal weight loss: Secondary | ICD-10-CM | POA: Diagnosis not present

## 2020-02-09 DIAGNOSIS — I272 Pulmonary hypertension, unspecified: Secondary | ICD-10-CM | POA: Diagnosis not present

## 2020-02-09 DIAGNOSIS — J8410 Pulmonary fibrosis, unspecified: Secondary | ICD-10-CM | POA: Diagnosis not present

## 2020-02-09 DIAGNOSIS — I700 Atherosclerosis of aorta: Secondary | ICD-10-CM | POA: Diagnosis not present

## 2020-02-09 DIAGNOSIS — J9611 Chronic respiratory failure with hypoxia: Secondary | ICD-10-CM | POA: Diagnosis not present

## 2020-02-10 DIAGNOSIS — J8410 Pulmonary fibrosis, unspecified: Secondary | ICD-10-CM | POA: Diagnosis not present

## 2020-02-10 DIAGNOSIS — R911 Solitary pulmonary nodule: Secondary | ICD-10-CM | POA: Diagnosis not present

## 2020-02-10 DIAGNOSIS — I700 Atherosclerosis of aorta: Secondary | ICD-10-CM | POA: Diagnosis not present

## 2020-02-10 DIAGNOSIS — I272 Pulmonary hypertension, unspecified: Secondary | ICD-10-CM | POA: Diagnosis not present

## 2020-02-10 DIAGNOSIS — R634 Abnormal weight loss: Secondary | ICD-10-CM | POA: Diagnosis not present

## 2020-02-10 DIAGNOSIS — J9611 Chronic respiratory failure with hypoxia: Secondary | ICD-10-CM | POA: Diagnosis not present

## 2020-02-11 DIAGNOSIS — E663 Overweight: Secondary | ICD-10-CM | POA: Diagnosis not present

## 2020-02-11 DIAGNOSIS — M0589 Other rheumatoid arthritis with rheumatoid factor of multiple sites: Secondary | ICD-10-CM | POA: Diagnosis not present

## 2020-02-11 DIAGNOSIS — Z6827 Body mass index (BMI) 27.0-27.9, adult: Secondary | ICD-10-CM | POA: Diagnosis not present

## 2020-02-11 DIAGNOSIS — Z79899 Other long term (current) drug therapy: Secondary | ICD-10-CM | POA: Diagnosis not present

## 2020-02-11 DIAGNOSIS — J449 Chronic obstructive pulmonary disease, unspecified: Secondary | ICD-10-CM | POA: Diagnosis not present

## 2020-02-11 DIAGNOSIS — G894 Chronic pain syndrome: Secondary | ICD-10-CM | POA: Diagnosis not present

## 2020-02-11 DIAGNOSIS — J8410 Pulmonary fibrosis, unspecified: Secondary | ICD-10-CM | POA: Diagnosis not present

## 2020-02-11 DIAGNOSIS — M2550 Pain in unspecified joint: Secondary | ICD-10-CM | POA: Diagnosis not present

## 2020-02-12 DIAGNOSIS — R911 Solitary pulmonary nodule: Secondary | ICD-10-CM | POA: Diagnosis not present

## 2020-02-12 DIAGNOSIS — I272 Pulmonary hypertension, unspecified: Secondary | ICD-10-CM | POA: Diagnosis not present

## 2020-02-12 DIAGNOSIS — J9611 Chronic respiratory failure with hypoxia: Secondary | ICD-10-CM | POA: Diagnosis not present

## 2020-02-12 DIAGNOSIS — J8410 Pulmonary fibrosis, unspecified: Secondary | ICD-10-CM | POA: Diagnosis not present

## 2020-02-12 DIAGNOSIS — R634 Abnormal weight loss: Secondary | ICD-10-CM | POA: Diagnosis not present

## 2020-02-12 DIAGNOSIS — I700 Atherosclerosis of aorta: Secondary | ICD-10-CM | POA: Diagnosis not present

## 2020-02-16 DIAGNOSIS — J9611 Chronic respiratory failure with hypoxia: Secondary | ICD-10-CM | POA: Diagnosis not present

## 2020-02-16 DIAGNOSIS — I700 Atherosclerosis of aorta: Secondary | ICD-10-CM | POA: Diagnosis not present

## 2020-02-16 DIAGNOSIS — R634 Abnormal weight loss: Secondary | ICD-10-CM | POA: Diagnosis not present

## 2020-02-16 DIAGNOSIS — J8410 Pulmonary fibrosis, unspecified: Secondary | ICD-10-CM | POA: Diagnosis not present

## 2020-02-16 DIAGNOSIS — I272 Pulmonary hypertension, unspecified: Secondary | ICD-10-CM | POA: Diagnosis not present

## 2020-02-16 DIAGNOSIS — R911 Solitary pulmonary nodule: Secondary | ICD-10-CM | POA: Diagnosis not present

## 2020-02-17 DIAGNOSIS — J8410 Pulmonary fibrosis, unspecified: Secondary | ICD-10-CM | POA: Diagnosis not present

## 2020-02-17 DIAGNOSIS — J9611 Chronic respiratory failure with hypoxia: Secondary | ICD-10-CM | POA: Diagnosis not present

## 2020-02-17 DIAGNOSIS — I700 Atherosclerosis of aorta: Secondary | ICD-10-CM | POA: Diagnosis not present

## 2020-02-17 DIAGNOSIS — R634 Abnormal weight loss: Secondary | ICD-10-CM | POA: Diagnosis not present

## 2020-02-17 DIAGNOSIS — R911 Solitary pulmonary nodule: Secondary | ICD-10-CM | POA: Diagnosis not present

## 2020-02-17 DIAGNOSIS — I272 Pulmonary hypertension, unspecified: Secondary | ICD-10-CM | POA: Diagnosis not present

## 2020-02-20 DIAGNOSIS — J8410 Pulmonary fibrosis, unspecified: Secondary | ICD-10-CM | POA: Diagnosis not present

## 2020-02-20 DIAGNOSIS — R634 Abnormal weight loss: Secondary | ICD-10-CM | POA: Diagnosis not present

## 2020-02-20 DIAGNOSIS — R911 Solitary pulmonary nodule: Secondary | ICD-10-CM | POA: Diagnosis not present

## 2020-02-20 DIAGNOSIS — I700 Atherosclerosis of aorta: Secondary | ICD-10-CM | POA: Diagnosis not present

## 2020-02-20 DIAGNOSIS — I272 Pulmonary hypertension, unspecified: Secondary | ICD-10-CM | POA: Diagnosis not present

## 2020-02-20 DIAGNOSIS — J9611 Chronic respiratory failure with hypoxia: Secondary | ICD-10-CM | POA: Diagnosis not present

## 2020-02-21 ENCOUNTER — Telehealth: Payer: Self-pay | Admitting: Internal Medicine

## 2020-02-21 NOTE — Telephone Encounter (Signed)
Irving Burton (cc Buelah Manis)  Pls give 15 min telephone visit or video viit with me or APP Beth just to circle back with patient/wife to see if they are pall care v hospice and what the decision on nodule was (if they decided to take rad onc empiric XRT approach) and current level symptoms First avail fine  Thanks  MR

## 2020-02-23 DIAGNOSIS — J8410 Pulmonary fibrosis, unspecified: Secondary | ICD-10-CM | POA: Diagnosis not present

## 2020-02-23 DIAGNOSIS — R911 Solitary pulmonary nodule: Secondary | ICD-10-CM | POA: Diagnosis not present

## 2020-02-23 DIAGNOSIS — R634 Abnormal weight loss: Secondary | ICD-10-CM | POA: Diagnosis not present

## 2020-02-23 DIAGNOSIS — J9611 Chronic respiratory failure with hypoxia: Secondary | ICD-10-CM | POA: Diagnosis not present

## 2020-02-23 DIAGNOSIS — I272 Pulmonary hypertension, unspecified: Secondary | ICD-10-CM | POA: Diagnosis not present

## 2020-02-23 DIAGNOSIS — I700 Atherosclerosis of aorta: Secondary | ICD-10-CM | POA: Diagnosis not present

## 2020-02-24 DIAGNOSIS — R911 Solitary pulmonary nodule: Secondary | ICD-10-CM | POA: Diagnosis not present

## 2020-02-24 DIAGNOSIS — R634 Abnormal weight loss: Secondary | ICD-10-CM | POA: Diagnosis not present

## 2020-02-24 DIAGNOSIS — I700 Atherosclerosis of aorta: Secondary | ICD-10-CM | POA: Diagnosis not present

## 2020-02-24 DIAGNOSIS — I272 Pulmonary hypertension, unspecified: Secondary | ICD-10-CM | POA: Diagnosis not present

## 2020-02-24 DIAGNOSIS — J8410 Pulmonary fibrosis, unspecified: Secondary | ICD-10-CM | POA: Diagnosis not present

## 2020-02-24 DIAGNOSIS — J9611 Chronic respiratory failure with hypoxia: Secondary | ICD-10-CM | POA: Diagnosis not present

## 2020-02-25 NOTE — Telephone Encounter (Signed)
Pt had an OV scheduled with Beth 8/31 but pt's wife called to cancel that appt as pt is with hospice.

## 2020-02-26 DIAGNOSIS — J9611 Chronic respiratory failure with hypoxia: Secondary | ICD-10-CM | POA: Diagnosis not present

## 2020-02-26 DIAGNOSIS — R911 Solitary pulmonary nodule: Secondary | ICD-10-CM | POA: Diagnosis not present

## 2020-02-26 DIAGNOSIS — I700 Atherosclerosis of aorta: Secondary | ICD-10-CM | POA: Diagnosis not present

## 2020-02-26 DIAGNOSIS — R634 Abnormal weight loss: Secondary | ICD-10-CM | POA: Diagnosis not present

## 2020-02-26 DIAGNOSIS — J8410 Pulmonary fibrosis, unspecified: Secondary | ICD-10-CM | POA: Diagnosis not present

## 2020-02-26 DIAGNOSIS — I272 Pulmonary hypertension, unspecified: Secondary | ICD-10-CM | POA: Diagnosis not present

## 2020-03-01 ENCOUNTER — Ambulatory Visit: Payer: Medicare Other | Admitting: Primary Care

## 2020-03-01 DIAGNOSIS — J8410 Pulmonary fibrosis, unspecified: Secondary | ICD-10-CM | POA: Diagnosis not present

## 2020-03-01 DIAGNOSIS — I700 Atherosclerosis of aorta: Secondary | ICD-10-CM | POA: Diagnosis not present

## 2020-03-01 DIAGNOSIS — I272 Pulmonary hypertension, unspecified: Secondary | ICD-10-CM | POA: Diagnosis not present

## 2020-03-01 DIAGNOSIS — R911 Solitary pulmonary nodule: Secondary | ICD-10-CM | POA: Diagnosis not present

## 2020-03-01 DIAGNOSIS — J9611 Chronic respiratory failure with hypoxia: Secondary | ICD-10-CM | POA: Diagnosis not present

## 2020-03-01 DIAGNOSIS — R634 Abnormal weight loss: Secondary | ICD-10-CM | POA: Diagnosis not present

## 2020-03-02 DIAGNOSIS — R634 Abnormal weight loss: Secondary | ICD-10-CM | POA: Diagnosis not present

## 2020-03-02 DIAGNOSIS — I272 Pulmonary hypertension, unspecified: Secondary | ICD-10-CM | POA: Diagnosis not present

## 2020-03-02 DIAGNOSIS — J9611 Chronic respiratory failure with hypoxia: Secondary | ICD-10-CM | POA: Diagnosis not present

## 2020-03-02 DIAGNOSIS — R911 Solitary pulmonary nodule: Secondary | ICD-10-CM | POA: Diagnosis not present

## 2020-03-02 DIAGNOSIS — I700 Atherosclerosis of aorta: Secondary | ICD-10-CM | POA: Diagnosis not present

## 2020-03-02 DIAGNOSIS — J8410 Pulmonary fibrosis, unspecified: Secondary | ICD-10-CM | POA: Diagnosis not present

## 2020-03-03 DIAGNOSIS — J8410 Pulmonary fibrosis, unspecified: Secondary | ICD-10-CM | POA: Diagnosis not present

## 2020-03-03 DIAGNOSIS — J9611 Chronic respiratory failure with hypoxia: Secondary | ICD-10-CM | POA: Diagnosis not present

## 2020-03-03 DIAGNOSIS — R634 Abnormal weight loss: Secondary | ICD-10-CM | POA: Diagnosis not present

## 2020-03-03 DIAGNOSIS — R911 Solitary pulmonary nodule: Secondary | ICD-10-CM | POA: Diagnosis not present

## 2020-03-03 DIAGNOSIS — I272 Pulmonary hypertension, unspecified: Secondary | ICD-10-CM | POA: Diagnosis not present

## 2020-03-03 DIAGNOSIS — I700 Atherosclerosis of aorta: Secondary | ICD-10-CM | POA: Diagnosis not present

## 2020-03-04 DIAGNOSIS — R911 Solitary pulmonary nodule: Secondary | ICD-10-CM | POA: Diagnosis not present

## 2020-03-04 DIAGNOSIS — I700 Atherosclerosis of aorta: Secondary | ICD-10-CM | POA: Diagnosis not present

## 2020-03-04 DIAGNOSIS — R634 Abnormal weight loss: Secondary | ICD-10-CM | POA: Diagnosis not present

## 2020-03-04 DIAGNOSIS — I272 Pulmonary hypertension, unspecified: Secondary | ICD-10-CM | POA: Diagnosis not present

## 2020-03-04 DIAGNOSIS — J9611 Chronic respiratory failure with hypoxia: Secondary | ICD-10-CM | POA: Diagnosis not present

## 2020-03-04 DIAGNOSIS — J8410 Pulmonary fibrosis, unspecified: Secondary | ICD-10-CM | POA: Diagnosis not present

## 2020-03-08 DIAGNOSIS — J8410 Pulmonary fibrosis, unspecified: Secondary | ICD-10-CM | POA: Diagnosis not present

## 2020-03-08 DIAGNOSIS — I700 Atherosclerosis of aorta: Secondary | ICD-10-CM | POA: Diagnosis not present

## 2020-03-08 DIAGNOSIS — I272 Pulmonary hypertension, unspecified: Secondary | ICD-10-CM | POA: Diagnosis not present

## 2020-03-08 DIAGNOSIS — R911 Solitary pulmonary nodule: Secondary | ICD-10-CM | POA: Diagnosis not present

## 2020-03-08 DIAGNOSIS — R634 Abnormal weight loss: Secondary | ICD-10-CM | POA: Diagnosis not present

## 2020-03-08 DIAGNOSIS — J9611 Chronic respiratory failure with hypoxia: Secondary | ICD-10-CM | POA: Diagnosis not present

## 2020-03-09 DIAGNOSIS — I700 Atherosclerosis of aorta: Secondary | ICD-10-CM | POA: Diagnosis not present

## 2020-03-09 DIAGNOSIS — R911 Solitary pulmonary nodule: Secondary | ICD-10-CM | POA: Diagnosis not present

## 2020-03-09 DIAGNOSIS — J9611 Chronic respiratory failure with hypoxia: Secondary | ICD-10-CM | POA: Diagnosis not present

## 2020-03-09 DIAGNOSIS — J8410 Pulmonary fibrosis, unspecified: Secondary | ICD-10-CM | POA: Diagnosis not present

## 2020-03-09 DIAGNOSIS — R634 Abnormal weight loss: Secondary | ICD-10-CM | POA: Diagnosis not present

## 2020-03-09 DIAGNOSIS — I272 Pulmonary hypertension, unspecified: Secondary | ICD-10-CM | POA: Diagnosis not present

## 2020-03-11 ENCOUNTER — Telehealth: Payer: Self-pay | Admitting: *Deleted

## 2020-03-11 DIAGNOSIS — I272 Pulmonary hypertension, unspecified: Secondary | ICD-10-CM | POA: Diagnosis not present

## 2020-03-11 DIAGNOSIS — J8410 Pulmonary fibrosis, unspecified: Secondary | ICD-10-CM | POA: Diagnosis not present

## 2020-03-11 DIAGNOSIS — I700 Atherosclerosis of aorta: Secondary | ICD-10-CM | POA: Diagnosis not present

## 2020-03-11 DIAGNOSIS — J9611 Chronic respiratory failure with hypoxia: Secondary | ICD-10-CM | POA: Diagnosis not present

## 2020-03-11 DIAGNOSIS — R911 Solitary pulmonary nodule: Secondary | ICD-10-CM | POA: Diagnosis not present

## 2020-03-11 DIAGNOSIS — R634 Abnormal weight loss: Secondary | ICD-10-CM | POA: Diagnosis not present

## 2020-03-11 NOTE — Telephone Encounter (Signed)
Per Dr. Marchelle Gearing, I called patient to see if they would like to be considered for rad onc tx.  I spoke to his wife and I listened while she explained.  She will speak to her husband and call me back with an update. I gave her my name and phone number to call.

## 2020-03-15 DIAGNOSIS — J9611 Chronic respiratory failure with hypoxia: Secondary | ICD-10-CM | POA: Diagnosis not present

## 2020-03-15 DIAGNOSIS — R634 Abnormal weight loss: Secondary | ICD-10-CM | POA: Diagnosis not present

## 2020-03-15 DIAGNOSIS — I272 Pulmonary hypertension, unspecified: Secondary | ICD-10-CM | POA: Diagnosis not present

## 2020-03-15 DIAGNOSIS — J8410 Pulmonary fibrosis, unspecified: Secondary | ICD-10-CM | POA: Diagnosis not present

## 2020-03-15 DIAGNOSIS — I700 Atherosclerosis of aorta: Secondary | ICD-10-CM | POA: Diagnosis not present

## 2020-03-15 DIAGNOSIS — R911 Solitary pulmonary nodule: Secondary | ICD-10-CM | POA: Diagnosis not present

## 2020-03-16 DIAGNOSIS — I272 Pulmonary hypertension, unspecified: Secondary | ICD-10-CM | POA: Diagnosis not present

## 2020-03-16 DIAGNOSIS — R634 Abnormal weight loss: Secondary | ICD-10-CM | POA: Diagnosis not present

## 2020-03-16 DIAGNOSIS — R911 Solitary pulmonary nodule: Secondary | ICD-10-CM | POA: Diagnosis not present

## 2020-03-16 DIAGNOSIS — J9611 Chronic respiratory failure with hypoxia: Secondary | ICD-10-CM | POA: Diagnosis not present

## 2020-03-16 DIAGNOSIS — J8410 Pulmonary fibrosis, unspecified: Secondary | ICD-10-CM | POA: Diagnosis not present

## 2020-03-16 DIAGNOSIS — I700 Atherosclerosis of aorta: Secondary | ICD-10-CM | POA: Diagnosis not present

## 2020-03-18 ENCOUNTER — Telehealth: Payer: Self-pay | Admitting: *Deleted

## 2020-03-18 DIAGNOSIS — J9611 Chronic respiratory failure with hypoxia: Secondary | ICD-10-CM | POA: Diagnosis not present

## 2020-03-18 DIAGNOSIS — I272 Pulmonary hypertension, unspecified: Secondary | ICD-10-CM | POA: Diagnosis not present

## 2020-03-18 DIAGNOSIS — I700 Atherosclerosis of aorta: Secondary | ICD-10-CM | POA: Diagnosis not present

## 2020-03-18 DIAGNOSIS — R911 Solitary pulmonary nodule: Secondary | ICD-10-CM | POA: Diagnosis not present

## 2020-03-18 DIAGNOSIS — J8410 Pulmonary fibrosis, unspecified: Secondary | ICD-10-CM | POA: Diagnosis not present

## 2020-03-18 DIAGNOSIS — R634 Abnormal weight loss: Secondary | ICD-10-CM | POA: Diagnosis not present

## 2020-03-18 NOTE — Telephone Encounter (Signed)
I called to follow up with patient if he would like tx or stay with Hospice. Patient has decided to stay with Hospice.  I will update Dr. Marchelle Gearing and Rad Onc.

## 2020-03-22 DIAGNOSIS — I272 Pulmonary hypertension, unspecified: Secondary | ICD-10-CM | POA: Diagnosis not present

## 2020-03-22 DIAGNOSIS — I700 Atherosclerosis of aorta: Secondary | ICD-10-CM | POA: Diagnosis not present

## 2020-03-22 DIAGNOSIS — R634 Abnormal weight loss: Secondary | ICD-10-CM | POA: Diagnosis not present

## 2020-03-22 DIAGNOSIS — J8410 Pulmonary fibrosis, unspecified: Secondary | ICD-10-CM | POA: Diagnosis not present

## 2020-03-22 DIAGNOSIS — J9611 Chronic respiratory failure with hypoxia: Secondary | ICD-10-CM | POA: Diagnosis not present

## 2020-03-22 DIAGNOSIS — R911 Solitary pulmonary nodule: Secondary | ICD-10-CM | POA: Diagnosis not present

## 2020-03-23 DIAGNOSIS — I272 Pulmonary hypertension, unspecified: Secondary | ICD-10-CM | POA: Diagnosis not present

## 2020-03-23 DIAGNOSIS — R911 Solitary pulmonary nodule: Secondary | ICD-10-CM | POA: Diagnosis not present

## 2020-03-23 DIAGNOSIS — I700 Atherosclerosis of aorta: Secondary | ICD-10-CM | POA: Diagnosis not present

## 2020-03-23 DIAGNOSIS — J9611 Chronic respiratory failure with hypoxia: Secondary | ICD-10-CM | POA: Diagnosis not present

## 2020-03-23 DIAGNOSIS — J8410 Pulmonary fibrosis, unspecified: Secondary | ICD-10-CM | POA: Diagnosis not present

## 2020-03-23 DIAGNOSIS — R634 Abnormal weight loss: Secondary | ICD-10-CM | POA: Diagnosis not present

## 2020-03-26 DIAGNOSIS — J9611 Chronic respiratory failure with hypoxia: Secondary | ICD-10-CM | POA: Diagnosis not present

## 2020-03-26 DIAGNOSIS — R634 Abnormal weight loss: Secondary | ICD-10-CM | POA: Diagnosis not present

## 2020-03-26 DIAGNOSIS — I700 Atherosclerosis of aorta: Secondary | ICD-10-CM | POA: Diagnosis not present

## 2020-03-26 DIAGNOSIS — J8410 Pulmonary fibrosis, unspecified: Secondary | ICD-10-CM | POA: Diagnosis not present

## 2020-03-26 DIAGNOSIS — R911 Solitary pulmonary nodule: Secondary | ICD-10-CM | POA: Diagnosis not present

## 2020-03-26 DIAGNOSIS — I272 Pulmonary hypertension, unspecified: Secondary | ICD-10-CM | POA: Diagnosis not present

## 2020-03-29 DIAGNOSIS — J9611 Chronic respiratory failure with hypoxia: Secondary | ICD-10-CM | POA: Diagnosis not present

## 2020-03-29 DIAGNOSIS — I272 Pulmonary hypertension, unspecified: Secondary | ICD-10-CM | POA: Diagnosis not present

## 2020-03-29 DIAGNOSIS — R634 Abnormal weight loss: Secondary | ICD-10-CM | POA: Diagnosis not present

## 2020-03-29 DIAGNOSIS — I700 Atherosclerosis of aorta: Secondary | ICD-10-CM | POA: Diagnosis not present

## 2020-03-29 DIAGNOSIS — R911 Solitary pulmonary nodule: Secondary | ICD-10-CM | POA: Diagnosis not present

## 2020-03-29 DIAGNOSIS — J8410 Pulmonary fibrosis, unspecified: Secondary | ICD-10-CM | POA: Diagnosis not present

## 2020-03-30 DIAGNOSIS — J8410 Pulmonary fibrosis, unspecified: Secondary | ICD-10-CM | POA: Diagnosis not present

## 2020-03-30 DIAGNOSIS — R911 Solitary pulmonary nodule: Secondary | ICD-10-CM | POA: Diagnosis not present

## 2020-03-30 DIAGNOSIS — J9611 Chronic respiratory failure with hypoxia: Secondary | ICD-10-CM | POA: Diagnosis not present

## 2020-03-30 DIAGNOSIS — R634 Abnormal weight loss: Secondary | ICD-10-CM | POA: Diagnosis not present

## 2020-03-30 DIAGNOSIS — I700 Atherosclerosis of aorta: Secondary | ICD-10-CM | POA: Diagnosis not present

## 2020-03-30 DIAGNOSIS — I272 Pulmonary hypertension, unspecified: Secondary | ICD-10-CM | POA: Diagnosis not present

## 2020-04-01 DIAGNOSIS — I272 Pulmonary hypertension, unspecified: Secondary | ICD-10-CM | POA: Diagnosis not present

## 2020-04-01 DIAGNOSIS — J8410 Pulmonary fibrosis, unspecified: Secondary | ICD-10-CM | POA: Diagnosis not present

## 2020-04-01 DIAGNOSIS — I700 Atherosclerosis of aorta: Secondary | ICD-10-CM | POA: Diagnosis not present

## 2020-04-01 DIAGNOSIS — R634 Abnormal weight loss: Secondary | ICD-10-CM | POA: Diagnosis not present

## 2020-04-01 DIAGNOSIS — R911 Solitary pulmonary nodule: Secondary | ICD-10-CM | POA: Diagnosis not present

## 2020-04-01 DIAGNOSIS — J9611 Chronic respiratory failure with hypoxia: Secondary | ICD-10-CM | POA: Diagnosis not present

## 2020-04-05 DIAGNOSIS — R634 Abnormal weight loss: Secondary | ICD-10-CM | POA: Diagnosis not present

## 2020-04-05 DIAGNOSIS — I700 Atherosclerosis of aorta: Secondary | ICD-10-CM | POA: Diagnosis not present

## 2020-04-05 DIAGNOSIS — I272 Pulmonary hypertension, unspecified: Secondary | ICD-10-CM | POA: Diagnosis not present

## 2020-04-05 DIAGNOSIS — J9611 Chronic respiratory failure with hypoxia: Secondary | ICD-10-CM | POA: Diagnosis not present

## 2020-04-05 DIAGNOSIS — J8410 Pulmonary fibrosis, unspecified: Secondary | ICD-10-CM | POA: Diagnosis not present

## 2020-04-05 DIAGNOSIS — R911 Solitary pulmonary nodule: Secondary | ICD-10-CM | POA: Diagnosis not present

## 2020-04-06 DIAGNOSIS — J9611 Chronic respiratory failure with hypoxia: Secondary | ICD-10-CM | POA: Diagnosis not present

## 2020-04-06 DIAGNOSIS — R634 Abnormal weight loss: Secondary | ICD-10-CM | POA: Diagnosis not present

## 2020-04-06 DIAGNOSIS — I700 Atherosclerosis of aorta: Secondary | ICD-10-CM | POA: Diagnosis not present

## 2020-04-06 DIAGNOSIS — R911 Solitary pulmonary nodule: Secondary | ICD-10-CM | POA: Diagnosis not present

## 2020-04-06 DIAGNOSIS — I272 Pulmonary hypertension, unspecified: Secondary | ICD-10-CM | POA: Diagnosis not present

## 2020-04-06 DIAGNOSIS — J8410 Pulmonary fibrosis, unspecified: Secondary | ICD-10-CM | POA: Diagnosis not present

## 2020-04-08 DIAGNOSIS — R911 Solitary pulmonary nodule: Secondary | ICD-10-CM | POA: Diagnosis not present

## 2020-04-08 DIAGNOSIS — J8410 Pulmonary fibrosis, unspecified: Secondary | ICD-10-CM | POA: Diagnosis not present

## 2020-04-08 DIAGNOSIS — I272 Pulmonary hypertension, unspecified: Secondary | ICD-10-CM | POA: Diagnosis not present

## 2020-04-08 DIAGNOSIS — R634 Abnormal weight loss: Secondary | ICD-10-CM | POA: Diagnosis not present

## 2020-04-08 DIAGNOSIS — J9611 Chronic respiratory failure with hypoxia: Secondary | ICD-10-CM | POA: Diagnosis not present

## 2020-04-08 DIAGNOSIS — I700 Atherosclerosis of aorta: Secondary | ICD-10-CM | POA: Diagnosis not present

## 2020-04-12 DIAGNOSIS — R911 Solitary pulmonary nodule: Secondary | ICD-10-CM | POA: Diagnosis not present

## 2020-04-12 DIAGNOSIS — J9611 Chronic respiratory failure with hypoxia: Secondary | ICD-10-CM | POA: Diagnosis not present

## 2020-04-12 DIAGNOSIS — J8410 Pulmonary fibrosis, unspecified: Secondary | ICD-10-CM | POA: Diagnosis not present

## 2020-04-12 DIAGNOSIS — I700 Atherosclerosis of aorta: Secondary | ICD-10-CM | POA: Diagnosis not present

## 2020-04-12 DIAGNOSIS — R634 Abnormal weight loss: Secondary | ICD-10-CM | POA: Diagnosis not present

## 2020-04-12 DIAGNOSIS — I272 Pulmonary hypertension, unspecified: Secondary | ICD-10-CM | POA: Diagnosis not present

## 2020-04-13 DIAGNOSIS — I700 Atherosclerosis of aorta: Secondary | ICD-10-CM | POA: Diagnosis not present

## 2020-04-13 DIAGNOSIS — I272 Pulmonary hypertension, unspecified: Secondary | ICD-10-CM | POA: Diagnosis not present

## 2020-04-13 DIAGNOSIS — J8410 Pulmonary fibrosis, unspecified: Secondary | ICD-10-CM | POA: Diagnosis not present

## 2020-04-13 DIAGNOSIS — J9611 Chronic respiratory failure with hypoxia: Secondary | ICD-10-CM | POA: Diagnosis not present

## 2020-04-13 DIAGNOSIS — R634 Abnormal weight loss: Secondary | ICD-10-CM | POA: Diagnosis not present

## 2020-04-13 DIAGNOSIS — R911 Solitary pulmonary nodule: Secondary | ICD-10-CM | POA: Diagnosis not present

## 2020-04-15 DIAGNOSIS — J8410 Pulmonary fibrosis, unspecified: Secondary | ICD-10-CM | POA: Diagnosis not present

## 2020-04-15 DIAGNOSIS — R634 Abnormal weight loss: Secondary | ICD-10-CM | POA: Diagnosis not present

## 2020-04-15 DIAGNOSIS — I700 Atherosclerosis of aorta: Secondary | ICD-10-CM | POA: Diagnosis not present

## 2020-04-15 DIAGNOSIS — J9611 Chronic respiratory failure with hypoxia: Secondary | ICD-10-CM | POA: Diagnosis not present

## 2020-04-15 DIAGNOSIS — R911 Solitary pulmonary nodule: Secondary | ICD-10-CM | POA: Diagnosis not present

## 2020-04-15 DIAGNOSIS — I272 Pulmonary hypertension, unspecified: Secondary | ICD-10-CM | POA: Diagnosis not present

## 2020-04-19 DIAGNOSIS — I700 Atherosclerosis of aorta: Secondary | ICD-10-CM | POA: Diagnosis not present

## 2020-04-19 DIAGNOSIS — R911 Solitary pulmonary nodule: Secondary | ICD-10-CM | POA: Diagnosis not present

## 2020-04-19 DIAGNOSIS — I272 Pulmonary hypertension, unspecified: Secondary | ICD-10-CM | POA: Diagnosis not present

## 2020-04-19 DIAGNOSIS — J8410 Pulmonary fibrosis, unspecified: Secondary | ICD-10-CM | POA: Diagnosis not present

## 2020-04-19 DIAGNOSIS — R634 Abnormal weight loss: Secondary | ICD-10-CM | POA: Diagnosis not present

## 2020-04-19 DIAGNOSIS — J9611 Chronic respiratory failure with hypoxia: Secondary | ICD-10-CM | POA: Diagnosis not present

## 2020-04-20 DIAGNOSIS — J8410 Pulmonary fibrosis, unspecified: Secondary | ICD-10-CM | POA: Diagnosis not present

## 2020-04-20 DIAGNOSIS — R634 Abnormal weight loss: Secondary | ICD-10-CM | POA: Diagnosis not present

## 2020-04-20 DIAGNOSIS — I272 Pulmonary hypertension, unspecified: Secondary | ICD-10-CM | POA: Diagnosis not present

## 2020-04-20 DIAGNOSIS — I700 Atherosclerosis of aorta: Secondary | ICD-10-CM | POA: Diagnosis not present

## 2020-04-20 DIAGNOSIS — J9611 Chronic respiratory failure with hypoxia: Secondary | ICD-10-CM | POA: Diagnosis not present

## 2020-04-20 DIAGNOSIS — R911 Solitary pulmonary nodule: Secondary | ICD-10-CM | POA: Diagnosis not present

## 2020-04-22 DIAGNOSIS — J9611 Chronic respiratory failure with hypoxia: Secondary | ICD-10-CM | POA: Diagnosis not present

## 2020-04-22 DIAGNOSIS — R911 Solitary pulmonary nodule: Secondary | ICD-10-CM | POA: Diagnosis not present

## 2020-04-22 DIAGNOSIS — R634 Abnormal weight loss: Secondary | ICD-10-CM | POA: Diagnosis not present

## 2020-04-22 DIAGNOSIS — Z23 Encounter for immunization: Secondary | ICD-10-CM | POA: Diagnosis not present

## 2020-04-22 DIAGNOSIS — I272 Pulmonary hypertension, unspecified: Secondary | ICD-10-CM | POA: Diagnosis not present

## 2020-04-22 DIAGNOSIS — J8410 Pulmonary fibrosis, unspecified: Secondary | ICD-10-CM | POA: Diagnosis not present

## 2020-04-22 DIAGNOSIS — I700 Atherosclerosis of aorta: Secondary | ICD-10-CM | POA: Diagnosis not present

## 2020-04-26 DIAGNOSIS — J9611 Chronic respiratory failure with hypoxia: Secondary | ICD-10-CM | POA: Diagnosis not present

## 2020-04-26 DIAGNOSIS — R634 Abnormal weight loss: Secondary | ICD-10-CM | POA: Diagnosis not present

## 2020-04-26 DIAGNOSIS — I700 Atherosclerosis of aorta: Secondary | ICD-10-CM | POA: Diagnosis not present

## 2020-04-26 DIAGNOSIS — R911 Solitary pulmonary nodule: Secondary | ICD-10-CM | POA: Diagnosis not present

## 2020-04-26 DIAGNOSIS — J8410 Pulmonary fibrosis, unspecified: Secondary | ICD-10-CM | POA: Diagnosis not present

## 2020-04-26 DIAGNOSIS — I272 Pulmonary hypertension, unspecified: Secondary | ICD-10-CM | POA: Diagnosis not present

## 2020-04-27 DIAGNOSIS — R911 Solitary pulmonary nodule: Secondary | ICD-10-CM | POA: Diagnosis not present

## 2020-04-27 DIAGNOSIS — J9611 Chronic respiratory failure with hypoxia: Secondary | ICD-10-CM | POA: Diagnosis not present

## 2020-04-27 DIAGNOSIS — I272 Pulmonary hypertension, unspecified: Secondary | ICD-10-CM | POA: Diagnosis not present

## 2020-04-27 DIAGNOSIS — I700 Atherosclerosis of aorta: Secondary | ICD-10-CM | POA: Diagnosis not present

## 2020-04-27 DIAGNOSIS — J8410 Pulmonary fibrosis, unspecified: Secondary | ICD-10-CM | POA: Diagnosis not present

## 2020-04-27 DIAGNOSIS — R634 Abnormal weight loss: Secondary | ICD-10-CM | POA: Diagnosis not present

## 2020-04-29 DIAGNOSIS — J8410 Pulmonary fibrosis, unspecified: Secondary | ICD-10-CM | POA: Diagnosis not present

## 2020-04-29 DIAGNOSIS — J9611 Chronic respiratory failure with hypoxia: Secondary | ICD-10-CM | POA: Diagnosis not present

## 2020-04-29 DIAGNOSIS — R634 Abnormal weight loss: Secondary | ICD-10-CM | POA: Diagnosis not present

## 2020-04-29 DIAGNOSIS — I700 Atherosclerosis of aorta: Secondary | ICD-10-CM | POA: Diagnosis not present

## 2020-04-29 DIAGNOSIS — I272 Pulmonary hypertension, unspecified: Secondary | ICD-10-CM | POA: Diagnosis not present

## 2020-04-29 DIAGNOSIS — R911 Solitary pulmonary nodule: Secondary | ICD-10-CM | POA: Diagnosis not present

## 2020-05-02 DIAGNOSIS — I700 Atherosclerosis of aorta: Secondary | ICD-10-CM | POA: Diagnosis not present

## 2020-05-02 DIAGNOSIS — J9611 Chronic respiratory failure with hypoxia: Secondary | ICD-10-CM | POA: Diagnosis not present

## 2020-05-02 DIAGNOSIS — I272 Pulmonary hypertension, unspecified: Secondary | ICD-10-CM | POA: Diagnosis not present

## 2020-05-02 DIAGNOSIS — J8410 Pulmonary fibrosis, unspecified: Secondary | ICD-10-CM | POA: Diagnosis not present

## 2020-05-02 DIAGNOSIS — R911 Solitary pulmonary nodule: Secondary | ICD-10-CM | POA: Diagnosis not present

## 2020-05-02 DIAGNOSIS — R634 Abnormal weight loss: Secondary | ICD-10-CM | POA: Diagnosis not present

## 2020-05-03 DIAGNOSIS — R634 Abnormal weight loss: Secondary | ICD-10-CM | POA: Diagnosis not present

## 2020-05-03 DIAGNOSIS — I700 Atherosclerosis of aorta: Secondary | ICD-10-CM | POA: Diagnosis not present

## 2020-05-03 DIAGNOSIS — J9611 Chronic respiratory failure with hypoxia: Secondary | ICD-10-CM | POA: Diagnosis not present

## 2020-05-03 DIAGNOSIS — R911 Solitary pulmonary nodule: Secondary | ICD-10-CM | POA: Diagnosis not present

## 2020-05-03 DIAGNOSIS — I272 Pulmonary hypertension, unspecified: Secondary | ICD-10-CM | POA: Diagnosis not present

## 2020-05-03 DIAGNOSIS — J8410 Pulmonary fibrosis, unspecified: Secondary | ICD-10-CM | POA: Diagnosis not present

## 2020-05-04 DIAGNOSIS — I272 Pulmonary hypertension, unspecified: Secondary | ICD-10-CM | POA: Diagnosis not present

## 2020-05-04 DIAGNOSIS — J8410 Pulmonary fibrosis, unspecified: Secondary | ICD-10-CM | POA: Diagnosis not present

## 2020-05-04 DIAGNOSIS — R911 Solitary pulmonary nodule: Secondary | ICD-10-CM | POA: Diagnosis not present

## 2020-05-04 DIAGNOSIS — I700 Atherosclerosis of aorta: Secondary | ICD-10-CM | POA: Diagnosis not present

## 2020-05-04 DIAGNOSIS — J9611 Chronic respiratory failure with hypoxia: Secondary | ICD-10-CM | POA: Diagnosis not present

## 2020-05-04 DIAGNOSIS — R634 Abnormal weight loss: Secondary | ICD-10-CM | POA: Diagnosis not present

## 2020-05-10 DIAGNOSIS — I272 Pulmonary hypertension, unspecified: Secondary | ICD-10-CM | POA: Diagnosis not present

## 2020-05-10 DIAGNOSIS — J8410 Pulmonary fibrosis, unspecified: Secondary | ICD-10-CM | POA: Diagnosis not present

## 2020-05-10 DIAGNOSIS — J9611 Chronic respiratory failure with hypoxia: Secondary | ICD-10-CM | POA: Diagnosis not present

## 2020-05-10 DIAGNOSIS — R911 Solitary pulmonary nodule: Secondary | ICD-10-CM | POA: Diagnosis not present

## 2020-05-10 DIAGNOSIS — R634 Abnormal weight loss: Secondary | ICD-10-CM | POA: Diagnosis not present

## 2020-05-10 DIAGNOSIS — I700 Atherosclerosis of aorta: Secondary | ICD-10-CM | POA: Diagnosis not present

## 2020-05-11 DIAGNOSIS — I272 Pulmonary hypertension, unspecified: Secondary | ICD-10-CM | POA: Diagnosis not present

## 2020-05-11 DIAGNOSIS — R634 Abnormal weight loss: Secondary | ICD-10-CM | POA: Diagnosis not present

## 2020-05-11 DIAGNOSIS — J9611 Chronic respiratory failure with hypoxia: Secondary | ICD-10-CM | POA: Diagnosis not present

## 2020-05-11 DIAGNOSIS — J8410 Pulmonary fibrosis, unspecified: Secondary | ICD-10-CM | POA: Diagnosis not present

## 2020-05-11 DIAGNOSIS — R911 Solitary pulmonary nodule: Secondary | ICD-10-CM | POA: Diagnosis not present

## 2020-05-11 DIAGNOSIS — I700 Atherosclerosis of aorta: Secondary | ICD-10-CM | POA: Diagnosis not present

## 2020-05-12 DIAGNOSIS — E663 Overweight: Secondary | ICD-10-CM | POA: Diagnosis not present

## 2020-05-12 DIAGNOSIS — M2550 Pain in unspecified joint: Secondary | ICD-10-CM | POA: Diagnosis not present

## 2020-05-12 DIAGNOSIS — G894 Chronic pain syndrome: Secondary | ICD-10-CM | POA: Diagnosis not present

## 2020-05-12 DIAGNOSIS — Z79899 Other long term (current) drug therapy: Secondary | ICD-10-CM | POA: Diagnosis not present

## 2020-05-12 DIAGNOSIS — Z6827 Body mass index (BMI) 27.0-27.9, adult: Secondary | ICD-10-CM | POA: Diagnosis not present

## 2020-05-12 DIAGNOSIS — J8410 Pulmonary fibrosis, unspecified: Secondary | ICD-10-CM | POA: Diagnosis not present

## 2020-05-12 DIAGNOSIS — J449 Chronic obstructive pulmonary disease, unspecified: Secondary | ICD-10-CM | POA: Diagnosis not present

## 2020-05-12 DIAGNOSIS — M0589 Other rheumatoid arthritis with rheumatoid factor of multiple sites: Secondary | ICD-10-CM | POA: Diagnosis not present

## 2020-05-13 DIAGNOSIS — I700 Atherosclerosis of aorta: Secondary | ICD-10-CM | POA: Diagnosis not present

## 2020-05-13 DIAGNOSIS — I272 Pulmonary hypertension, unspecified: Secondary | ICD-10-CM | POA: Diagnosis not present

## 2020-05-13 DIAGNOSIS — R911 Solitary pulmonary nodule: Secondary | ICD-10-CM | POA: Diagnosis not present

## 2020-05-13 DIAGNOSIS — R634 Abnormal weight loss: Secondary | ICD-10-CM | POA: Diagnosis not present

## 2020-05-13 DIAGNOSIS — J8410 Pulmonary fibrosis, unspecified: Secondary | ICD-10-CM | POA: Diagnosis not present

## 2020-05-13 DIAGNOSIS — J9611 Chronic respiratory failure with hypoxia: Secondary | ICD-10-CM | POA: Diagnosis not present

## 2020-05-17 DIAGNOSIS — J8410 Pulmonary fibrosis, unspecified: Secondary | ICD-10-CM | POA: Diagnosis not present

## 2020-05-17 DIAGNOSIS — I700 Atherosclerosis of aorta: Secondary | ICD-10-CM | POA: Diagnosis not present

## 2020-05-17 DIAGNOSIS — R634 Abnormal weight loss: Secondary | ICD-10-CM | POA: Diagnosis not present

## 2020-05-17 DIAGNOSIS — R911 Solitary pulmonary nodule: Secondary | ICD-10-CM | POA: Diagnosis not present

## 2020-05-17 DIAGNOSIS — I272 Pulmonary hypertension, unspecified: Secondary | ICD-10-CM | POA: Diagnosis not present

## 2020-05-17 DIAGNOSIS — J9611 Chronic respiratory failure with hypoxia: Secondary | ICD-10-CM | POA: Diagnosis not present

## 2020-05-18 DIAGNOSIS — R911 Solitary pulmonary nodule: Secondary | ICD-10-CM | POA: Diagnosis not present

## 2020-05-18 DIAGNOSIS — J9611 Chronic respiratory failure with hypoxia: Secondary | ICD-10-CM | POA: Diagnosis not present

## 2020-05-18 DIAGNOSIS — R634 Abnormal weight loss: Secondary | ICD-10-CM | POA: Diagnosis not present

## 2020-05-18 DIAGNOSIS — I272 Pulmonary hypertension, unspecified: Secondary | ICD-10-CM | POA: Diagnosis not present

## 2020-05-18 DIAGNOSIS — J8410 Pulmonary fibrosis, unspecified: Secondary | ICD-10-CM | POA: Diagnosis not present

## 2020-05-18 DIAGNOSIS — I700 Atherosclerosis of aorta: Secondary | ICD-10-CM | POA: Diagnosis not present

## 2020-05-19 DIAGNOSIS — J9611 Chronic respiratory failure with hypoxia: Secondary | ICD-10-CM | POA: Diagnosis not present

## 2020-05-19 DIAGNOSIS — J8410 Pulmonary fibrosis, unspecified: Secondary | ICD-10-CM | POA: Diagnosis not present

## 2020-05-19 DIAGNOSIS — I700 Atherosclerosis of aorta: Secondary | ICD-10-CM | POA: Diagnosis not present

## 2020-05-19 DIAGNOSIS — R634 Abnormal weight loss: Secondary | ICD-10-CM | POA: Diagnosis not present

## 2020-05-19 DIAGNOSIS — R911 Solitary pulmonary nodule: Secondary | ICD-10-CM | POA: Diagnosis not present

## 2020-05-19 DIAGNOSIS — I272 Pulmonary hypertension, unspecified: Secondary | ICD-10-CM | POA: Diagnosis not present

## 2020-05-24 DIAGNOSIS — R634 Abnormal weight loss: Secondary | ICD-10-CM | POA: Diagnosis not present

## 2020-05-24 DIAGNOSIS — J9611 Chronic respiratory failure with hypoxia: Secondary | ICD-10-CM | POA: Diagnosis not present

## 2020-05-24 DIAGNOSIS — R911 Solitary pulmonary nodule: Secondary | ICD-10-CM | POA: Diagnosis not present

## 2020-05-24 DIAGNOSIS — I272 Pulmonary hypertension, unspecified: Secondary | ICD-10-CM | POA: Diagnosis not present

## 2020-05-24 DIAGNOSIS — J8410 Pulmonary fibrosis, unspecified: Secondary | ICD-10-CM | POA: Diagnosis not present

## 2020-05-24 DIAGNOSIS — I700 Atherosclerosis of aorta: Secondary | ICD-10-CM | POA: Diagnosis not present

## 2020-05-25 DIAGNOSIS — J9611 Chronic respiratory failure with hypoxia: Secondary | ICD-10-CM | POA: Diagnosis not present

## 2020-05-25 DIAGNOSIS — I272 Pulmonary hypertension, unspecified: Secondary | ICD-10-CM | POA: Diagnosis not present

## 2020-05-25 DIAGNOSIS — J8410 Pulmonary fibrosis, unspecified: Secondary | ICD-10-CM | POA: Diagnosis not present

## 2020-05-25 DIAGNOSIS — I700 Atherosclerosis of aorta: Secondary | ICD-10-CM | POA: Diagnosis not present

## 2020-05-25 DIAGNOSIS — R911 Solitary pulmonary nodule: Secondary | ICD-10-CM | POA: Diagnosis not present

## 2020-05-25 DIAGNOSIS — R634 Abnormal weight loss: Secondary | ICD-10-CM | POA: Diagnosis not present

## 2020-06-01 DIAGNOSIS — I700 Atherosclerosis of aorta: Secondary | ICD-10-CM | POA: Diagnosis not present

## 2020-06-01 DIAGNOSIS — J8410 Pulmonary fibrosis, unspecified: Secondary | ICD-10-CM | POA: Diagnosis not present

## 2020-06-01 DIAGNOSIS — J9611 Chronic respiratory failure with hypoxia: Secondary | ICD-10-CM | POA: Diagnosis not present

## 2020-06-01 DIAGNOSIS — R911 Solitary pulmonary nodule: Secondary | ICD-10-CM | POA: Diagnosis not present

## 2020-06-01 DIAGNOSIS — R634 Abnormal weight loss: Secondary | ICD-10-CM | POA: Diagnosis not present

## 2020-06-01 DIAGNOSIS — I272 Pulmonary hypertension, unspecified: Secondary | ICD-10-CM | POA: Diagnosis not present

## 2020-06-03 DIAGNOSIS — J9611 Chronic respiratory failure with hypoxia: Secondary | ICD-10-CM | POA: Diagnosis not present

## 2020-06-03 DIAGNOSIS — I700 Atherosclerosis of aorta: Secondary | ICD-10-CM | POA: Diagnosis not present

## 2020-06-03 DIAGNOSIS — R634 Abnormal weight loss: Secondary | ICD-10-CM | POA: Diagnosis not present

## 2020-06-03 DIAGNOSIS — R911 Solitary pulmonary nodule: Secondary | ICD-10-CM | POA: Diagnosis not present

## 2020-06-03 DIAGNOSIS — I272 Pulmonary hypertension, unspecified: Secondary | ICD-10-CM | POA: Diagnosis not present

## 2020-06-03 DIAGNOSIS — J8410 Pulmonary fibrosis, unspecified: Secondary | ICD-10-CM | POA: Diagnosis not present

## 2020-06-10 ENCOUNTER — Telehealth: Payer: Self-pay | Admitting: Internal Medicine

## 2020-06-10 NOTE — Telephone Encounter (Signed)
Attempted to call pt's wife Thelma Barge but line went straight to VM. I did leave a detailed message for her giving her our condolences and also stated that we would send this to MR so he is made aware.  Condolence card has been obtained and has been placed in MR's folder for him to sign.

## 2020-06-17 ENCOUNTER — Ambulatory Visit (HOSPITAL_COMMUNITY)

## 2020-07-02 DEATH — deceased

## 2020-07-15 IMAGING — CT CT CHEST WITHOUT CONTRAST
2 of 3 series · 15 of 36 positions shown, 18 images · non-contrast
Comparison: 08/01/2018 from [HOSPITAL]

CLINICAL DATA: Followup left lung nodule and mediastinal
lymphadenopathy. Fibrotic interstitial lung disease.

EXAM:
CT CHEST WITHOUT CONTRAST
TECHNIQUE: Multidetector CT imaging of the chest was performed following the
standard protocol without IV contrast.

[Series 2: thorax · axial · 0.72mm/px · z∈[+1306,+1582]mm · 12 of 162 slices shown, 15 images]
[im 12/162  mediastinal]
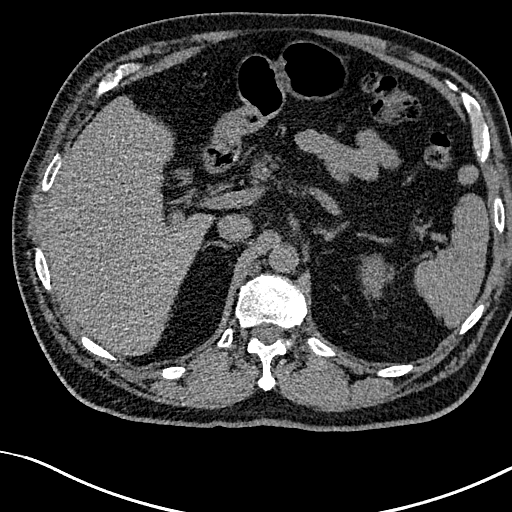
[im 12/162  lung]
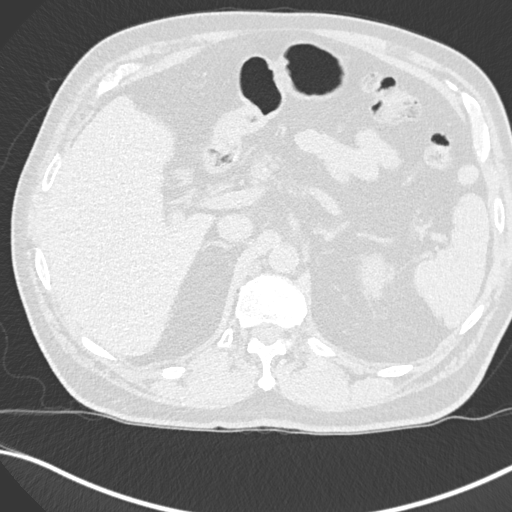
[im 24/162  lung]
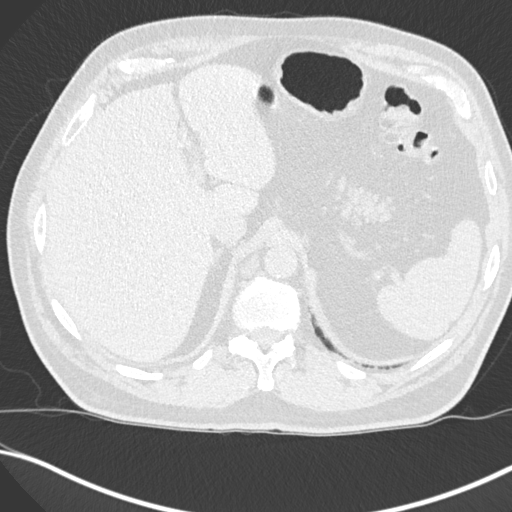
[im 36/162  lung]
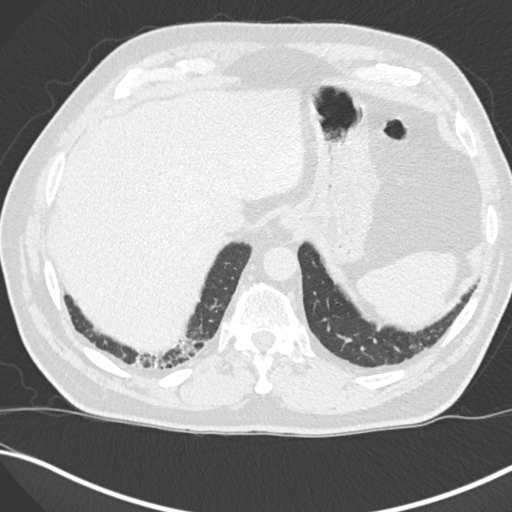
[im 48/162  lung]
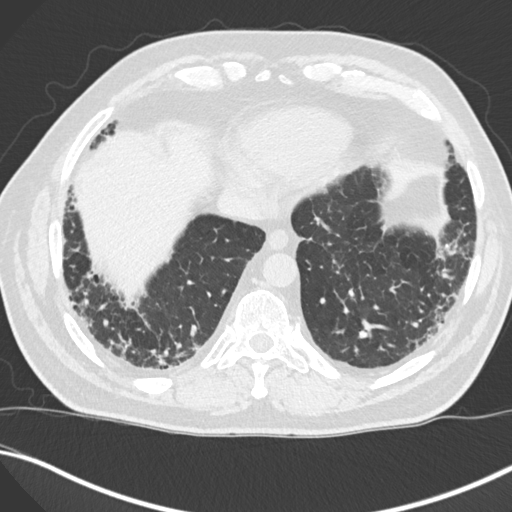
[im 60/162  mediastinal]
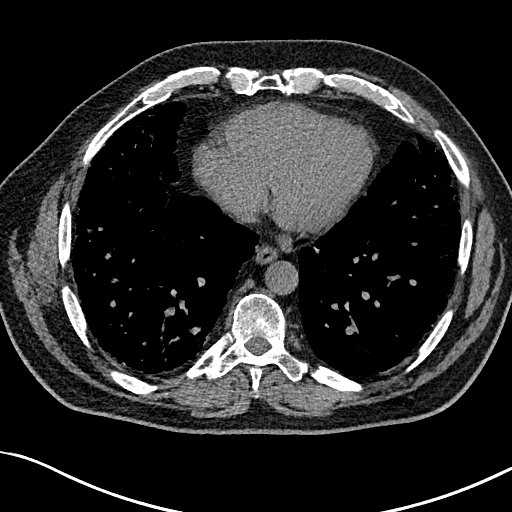
[im 60/162  lung]
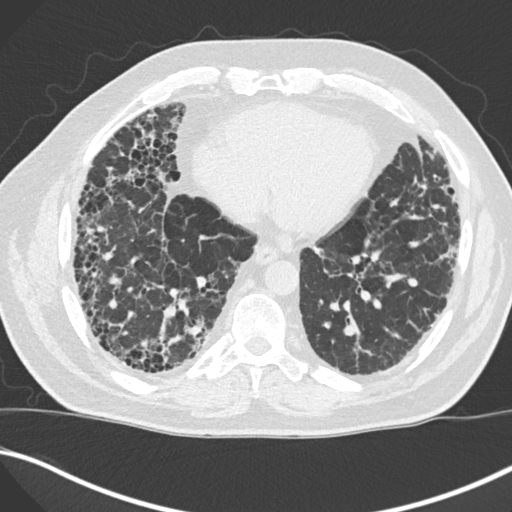
[im 72/162  lung]
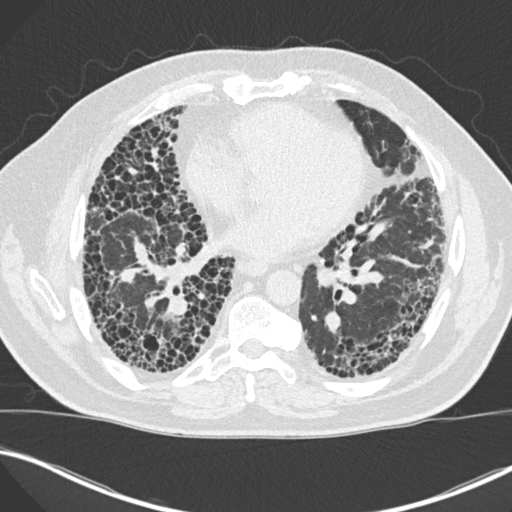
[im 90/162  lung]
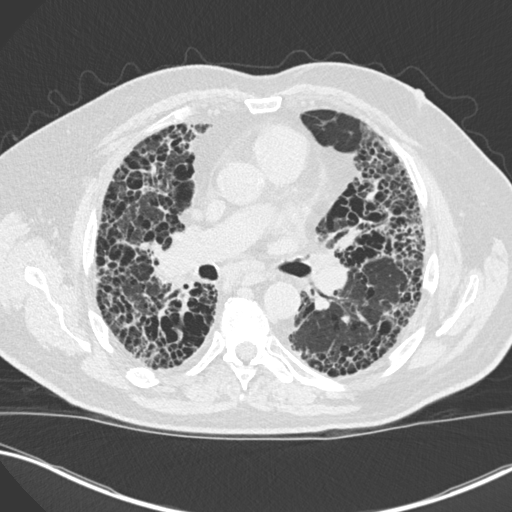
[im 102/162  lung]
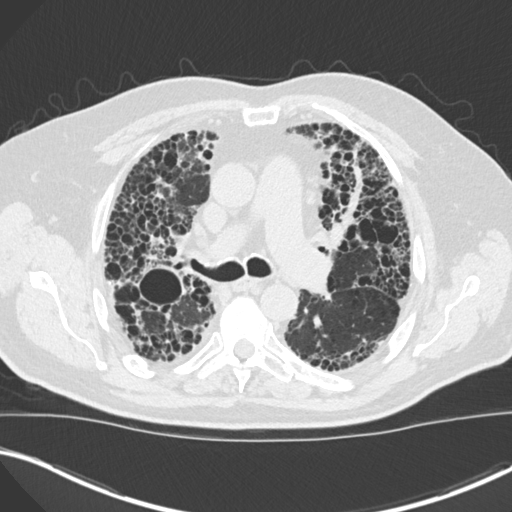
[im 114/162  mediastinal]
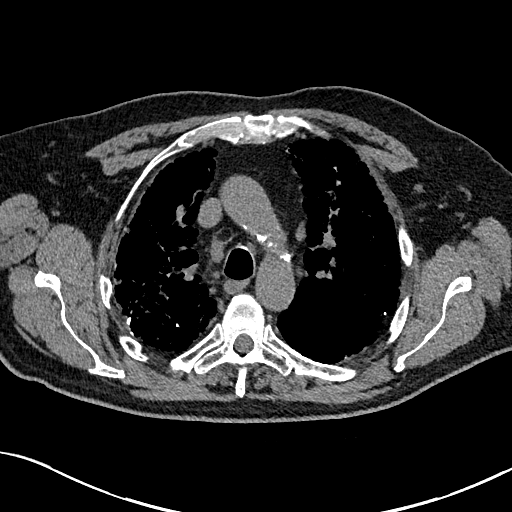
[im 114/162  lung]
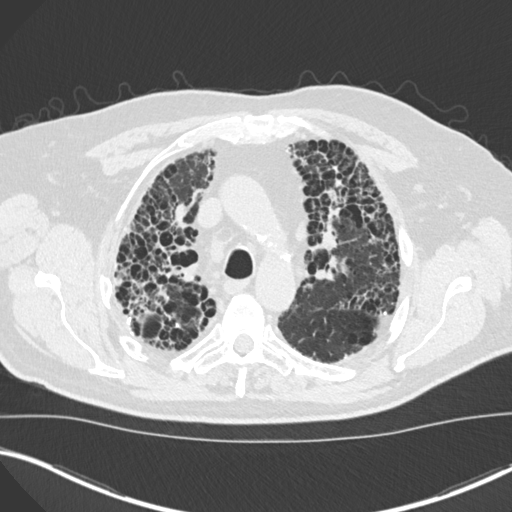
[im 126/162  lung]
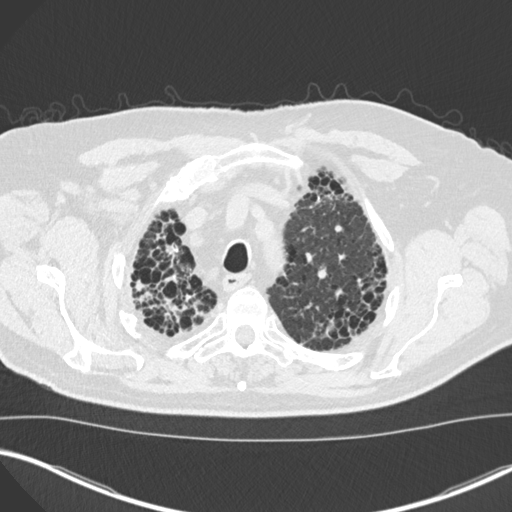
[im 138/162  lung]
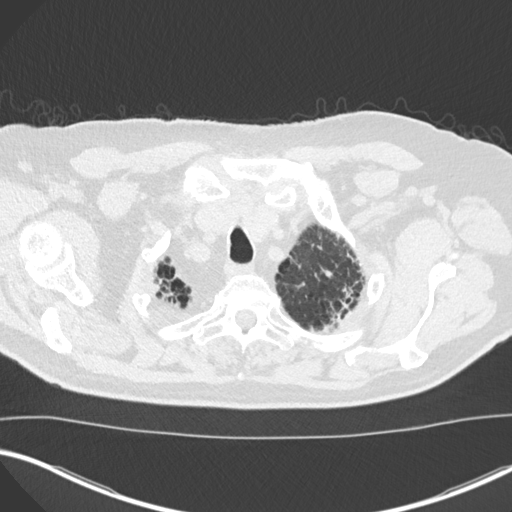
[im 150/162  lung]
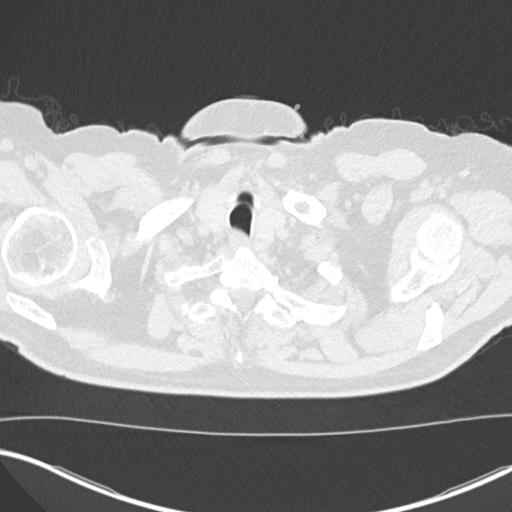

[Series 5: coronal · coronal · 0.66mm/px · 3 of 151 slices shown]
[im 31/151  lung]
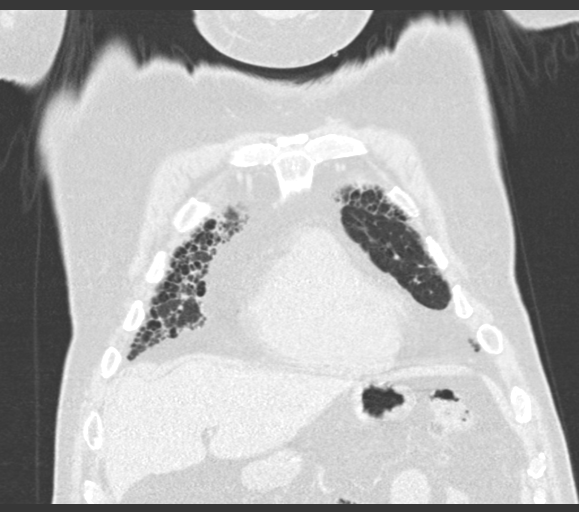
[im 61/151  lung]
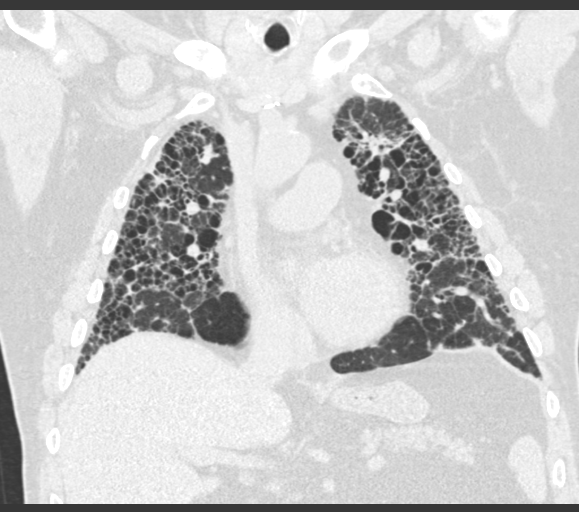
[im 91/151  lung]
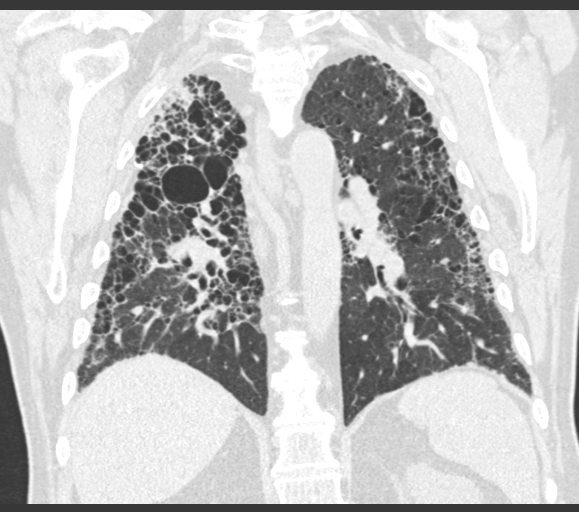

[15 of 36 positions shown; findings below may reference images not displayed]

FINDINGS: Cardiovascular: No acute findings. Aortic and coronary artery
atherosclerosis.

Mediastinum/Nodes: Stable shotty lymph nodes throughout the
mediastinum measuring up to 1.1 cm. Stable mild bilateral hilar
lymphadenopathy, which is difficult to measure on this unenhanced
exam. No new or increased areas of lymphadenopathy within the
thorax.

Lungs/Pleura: Chronic diffuse pulmonary interstitial fibrosis with
advanced honeycombing is again demonstrated. An ill-defined nodular
focus of consolidation is again seen in the left upper lobe
measuring 2.7 cm which is unchanged. This has some bronchiectatic
air bronchograms, and likely represents an area of confluent
fibrosis, with neoplasm considered much less likely. No new or
worsening areas of pulmonary opacity are seen. No evidence of
pleural effusion.

Upper Abdomen:  Unremarkable.

Musculoskeletal:  No suspicious bone lesions.
IMPRESSION: 1. Stable 2.7 cm ill-defined nodular focus of consolidation in left
upper lobe, with associated bronchiectatic air bronchograms. This
likely represents an area of confluent fibrosis, with neoplasm
considered much less likely. Continued followup by chest CT without
contrast recommended in 6 months.
2. Stable mild mediastinal and bilateral hilar lymphadenopathy,
likely reactive in etiology.
3. Stable chronic pulmonary interstitial fibrosis with advanced
honeycombing.

Aortic Atherosclerosis ([id number]). Coronary artery
atherosclerosis.
